# Patient Record
Sex: Male | Born: 1945
Health system: Southern US, Community
[De-identification: ages and names within clinical notes are randomized; demographics above are authoritative.]

---

## 2015-08-18 DIAGNOSIS — M791 Myalgia: Secondary | ICD-10-CM | POA: Diagnosis not present

## 2015-08-18 DIAGNOSIS — R350 Frequency of micturition: Secondary | ICD-10-CM | POA: Diagnosis not present

## 2015-08-18 DIAGNOSIS — R590 Localized enlarged lymph nodes: Secondary | ICD-10-CM | POA: Diagnosis not present

## 2015-08-18 DIAGNOSIS — R030 Elevated blood-pressure reading, without diagnosis of hypertension: Secondary | ICD-10-CM | POA: Diagnosis not present

## 2015-08-18 DIAGNOSIS — L259 Unspecified contact dermatitis, unspecified cause: Secondary | ICD-10-CM | POA: Diagnosis not present

## 2015-08-24 DIAGNOSIS — Z794 Long term (current) use of insulin: Secondary | ICD-10-CM | POA: Diagnosis not present

## 2015-08-24 DIAGNOSIS — M791 Myalgia: Secondary | ICD-10-CM | POA: Diagnosis not present

## 2015-08-24 DIAGNOSIS — Z1211 Encounter for screening for malignant neoplasm of colon: Secondary | ICD-10-CM | POA: Diagnosis not present

## 2015-08-24 DIAGNOSIS — Z23 Encounter for immunization: Secondary | ICD-10-CM | POA: Diagnosis not present

## 2015-08-24 DIAGNOSIS — E1165 Type 2 diabetes mellitus with hyperglycemia: Secondary | ICD-10-CM | POA: Diagnosis not present

## 2015-09-12 DIAGNOSIS — Z7189 Other specified counseling: Secondary | ICD-10-CM | POA: Diagnosis not present

## 2015-09-12 DIAGNOSIS — Z23 Encounter for immunization: Secondary | ICD-10-CM | POA: Diagnosis not present

## 2015-10-10 ENCOUNTER — Other Ambulatory Visit: Payer: Self-pay | Admitting: Family

## 2015-10-10 ENCOUNTER — Ambulatory Visit
Admission: RE | Admit: 2015-10-10 | Discharge: 2015-10-10 | Disposition: A | Discharge status: Home / Self Care | Payer: Medicare Other | Source: Ambulatory Visit | Attending: Family | Admitting: Family

## 2015-10-10 DIAGNOSIS — M79604 Pain in right leg: Secondary | ICD-10-CM | POA: Diagnosis not present

## 2015-10-10 DIAGNOSIS — M5136 Other intervertebral disc degeneration, lumbar region: Secondary | ICD-10-CM | POA: Diagnosis not present

## 2015-10-10 DIAGNOSIS — M25552 Pain in left hip: Secondary | ICD-10-CM | POA: Diagnosis not present

## 2015-10-10 DIAGNOSIS — M1611 Unilateral primary osteoarthritis, right hip: Secondary | ICD-10-CM | POA: Diagnosis not present

## 2015-10-10 DIAGNOSIS — E1165 Type 2 diabetes mellitus with hyperglycemia: Secondary | ICD-10-CM | POA: Diagnosis not present

## 2015-10-10 DIAGNOSIS — R194 Change in bowel habit: Secondary | ICD-10-CM | POA: Diagnosis not present

## 2015-10-10 DIAGNOSIS — M25551 Pain in right hip: Secondary | ICD-10-CM | POA: Diagnosis not present

## 2015-10-28 DIAGNOSIS — M791 Myalgia: Secondary | ICD-10-CM | POA: Diagnosis not present

## 2015-10-28 DIAGNOSIS — M25551 Pain in right hip: Secondary | ICD-10-CM | POA: Diagnosis not present

## 2015-11-02 DIAGNOSIS — R194 Change in bowel habit: Secondary | ICD-10-CM | POA: Diagnosis not present

## 2015-11-02 DIAGNOSIS — M791 Myalgia: Secondary | ICD-10-CM | POA: Diagnosis not present

## 2015-11-08 DIAGNOSIS — Z13220 Encounter for screening for lipoid disorders: Secondary | ICD-10-CM | POA: Diagnosis not present

## 2015-11-08 DIAGNOSIS — E1165 Type 2 diabetes mellitus with hyperglycemia: Secondary | ICD-10-CM | POA: Diagnosis not present

## 2015-11-08 DIAGNOSIS — F4321 Adjustment disorder with depressed mood: Secondary | ICD-10-CM | POA: Diagnosis not present

## 2015-11-08 DIAGNOSIS — Z79899 Other long term (current) drug therapy: Secondary | ICD-10-CM | POA: Diagnosis not present

## 2015-11-08 DIAGNOSIS — Z794 Long term (current) use of insulin: Secondary | ICD-10-CM | POA: Diagnosis not present

## 2015-11-14 DIAGNOSIS — E1165 Type 2 diabetes mellitus with hyperglycemia: Secondary | ICD-10-CM | POA: Diagnosis not present

## 2015-11-14 DIAGNOSIS — Z13220 Encounter for screening for lipoid disorders: Secondary | ICD-10-CM | POA: Diagnosis not present

## 2015-11-14 DIAGNOSIS — Z79899 Other long term (current) drug therapy: Secondary | ICD-10-CM | POA: Diagnosis not present

## 2015-12-08 DIAGNOSIS — D122 Benign neoplasm of ascending colon: Secondary | ICD-10-CM | POA: Diagnosis not present

## 2015-12-08 DIAGNOSIS — K648 Other hemorrhoids: Secondary | ICD-10-CM | POA: Diagnosis not present

## 2015-12-08 DIAGNOSIS — Z794 Long term (current) use of insulin: Secondary | ICD-10-CM | POA: Diagnosis not present

## 2015-12-08 DIAGNOSIS — I10 Essential (primary) hypertension: Secondary | ICD-10-CM | POA: Diagnosis not present

## 2015-12-08 DIAGNOSIS — K6289 Other specified diseases of anus and rectum: Secondary | ICD-10-CM | POA: Diagnosis not present

## 2015-12-08 DIAGNOSIS — E119 Type 2 diabetes mellitus without complications: Secondary | ICD-10-CM | POA: Diagnosis not present

## 2015-12-08 DIAGNOSIS — D125 Benign neoplasm of sigmoid colon: Secondary | ICD-10-CM | POA: Diagnosis not present

## 2015-12-08 DIAGNOSIS — R194 Change in bowel habit: Secondary | ICD-10-CM | POA: Diagnosis not present

## 2016-01-09 DIAGNOSIS — E7800 Pure hypercholesterolemia, unspecified: Secondary | ICD-10-CM | POA: Diagnosis not present

## 2016-01-09 DIAGNOSIS — E1165 Type 2 diabetes mellitus with hyperglycemia: Secondary | ICD-10-CM | POA: Diagnosis not present

## 2016-01-16 DIAGNOSIS — K6289 Other specified diseases of anus and rectum: Secondary | ICD-10-CM | POA: Diagnosis not present

## 2016-02-01 DIAGNOSIS — K6289 Other specified diseases of anus and rectum: Secondary | ICD-10-CM | POA: Diagnosis not present

## 2016-02-01 DIAGNOSIS — E119 Type 2 diabetes mellitus without complications: Secondary | ICD-10-CM | POA: Diagnosis not present

## 2016-03-14 DIAGNOSIS — M62838 Other muscle spasm: Secondary | ICD-10-CM | POA: Diagnosis not present

## 2016-03-14 DIAGNOSIS — R102 Pelvic and perineal pain: Secondary | ICD-10-CM | POA: Diagnosis not present

## 2016-03-14 DIAGNOSIS — K5900 Constipation, unspecified: Secondary | ICD-10-CM | POA: Diagnosis not present

## 2016-03-14 DIAGNOSIS — R278 Other lack of coordination: Secondary | ICD-10-CM | POA: Diagnosis not present

## 2016-03-18 DIAGNOSIS — R278 Other lack of coordination: Secondary | ICD-10-CM | POA: Diagnosis not present

## 2016-03-18 DIAGNOSIS — M62838 Other muscle spasm: Secondary | ICD-10-CM | POA: Diagnosis not present

## 2016-03-18 DIAGNOSIS — R102 Pelvic and perineal pain: Secondary | ICD-10-CM | POA: Diagnosis not present

## 2016-03-18 DIAGNOSIS — K5900 Constipation, unspecified: Secondary | ICD-10-CM | POA: Diagnosis not present

## 2016-03-18 DIAGNOSIS — M6281 Muscle weakness (generalized): Secondary | ICD-10-CM | POA: Diagnosis not present

## 2016-03-26 DIAGNOSIS — K5900 Constipation, unspecified: Secondary | ICD-10-CM | POA: Diagnosis not present

## 2016-03-26 DIAGNOSIS — M6281 Muscle weakness (generalized): Secondary | ICD-10-CM | POA: Diagnosis not present

## 2016-03-26 DIAGNOSIS — M62838 Other muscle spasm: Secondary | ICD-10-CM | POA: Diagnosis not present

## 2016-03-26 DIAGNOSIS — R278 Other lack of coordination: Secondary | ICD-10-CM | POA: Diagnosis not present

## 2016-03-26 DIAGNOSIS — R102 Pelvic and perineal pain: Secondary | ICD-10-CM | POA: Diagnosis not present

## 2016-03-28 DIAGNOSIS — R102 Pelvic and perineal pain: Secondary | ICD-10-CM | POA: Diagnosis not present

## 2016-03-28 DIAGNOSIS — M6281 Muscle weakness (generalized): Secondary | ICD-10-CM | POA: Diagnosis not present

## 2016-03-28 DIAGNOSIS — R278 Other lack of coordination: Secondary | ICD-10-CM | POA: Diagnosis not present

## 2016-03-28 DIAGNOSIS — M62838 Other muscle spasm: Secondary | ICD-10-CM | POA: Diagnosis not present

## 2016-03-28 DIAGNOSIS — K5900 Constipation, unspecified: Secondary | ICD-10-CM | POA: Diagnosis not present

## 2016-04-02 DIAGNOSIS — R102 Pelvic and perineal pain: Secondary | ICD-10-CM | POA: Diagnosis not present

## 2016-04-02 DIAGNOSIS — M62838 Other muscle spasm: Secondary | ICD-10-CM | POA: Diagnosis not present

## 2016-04-02 DIAGNOSIS — K5900 Constipation, unspecified: Secondary | ICD-10-CM | POA: Diagnosis not present

## 2016-04-02 DIAGNOSIS — M6281 Muscle weakness (generalized): Secondary | ICD-10-CM | POA: Diagnosis not present

## 2016-04-02 DIAGNOSIS — R278 Other lack of coordination: Secondary | ICD-10-CM | POA: Diagnosis not present

## 2016-04-04 DIAGNOSIS — R278 Other lack of coordination: Secondary | ICD-10-CM | POA: Diagnosis not present

## 2016-04-04 DIAGNOSIS — M62838 Other muscle spasm: Secondary | ICD-10-CM | POA: Diagnosis not present

## 2016-04-04 DIAGNOSIS — M6281 Muscle weakness (generalized): Secondary | ICD-10-CM | POA: Diagnosis not present

## 2016-04-04 DIAGNOSIS — K5900 Constipation, unspecified: Secondary | ICD-10-CM | POA: Diagnosis not present

## 2016-04-04 DIAGNOSIS — R102 Pelvic and perineal pain: Secondary | ICD-10-CM | POA: Diagnosis not present

## 2016-04-08 DIAGNOSIS — M62838 Other muscle spasm: Secondary | ICD-10-CM | POA: Diagnosis not present

## 2016-04-08 DIAGNOSIS — R102 Pelvic and perineal pain: Secondary | ICD-10-CM | POA: Diagnosis not present

## 2016-04-08 DIAGNOSIS — R278 Other lack of coordination: Secondary | ICD-10-CM | POA: Diagnosis not present

## 2016-04-08 DIAGNOSIS — M6281 Muscle weakness (generalized): Secondary | ICD-10-CM | POA: Diagnosis not present

## 2016-04-08 DIAGNOSIS — K5900 Constipation, unspecified: Secondary | ICD-10-CM | POA: Diagnosis not present

## 2016-04-10 DIAGNOSIS — R278 Other lack of coordination: Secondary | ICD-10-CM | POA: Diagnosis not present

## 2016-04-10 DIAGNOSIS — R102 Pelvic and perineal pain: Secondary | ICD-10-CM | POA: Diagnosis not present

## 2016-04-10 DIAGNOSIS — M6281 Muscle weakness (generalized): Secondary | ICD-10-CM | POA: Diagnosis not present

## 2016-04-10 DIAGNOSIS — M62838 Other muscle spasm: Secondary | ICD-10-CM | POA: Diagnosis not present

## 2016-04-10 DIAGNOSIS — K5900 Constipation, unspecified: Secondary | ICD-10-CM | POA: Diagnosis not present

## 2016-04-15 DIAGNOSIS — R278 Other lack of coordination: Secondary | ICD-10-CM | POA: Diagnosis not present

## 2016-04-15 DIAGNOSIS — M62838 Other muscle spasm: Secondary | ICD-10-CM | POA: Diagnosis not present

## 2016-04-15 DIAGNOSIS — M6281 Muscle weakness (generalized): Secondary | ICD-10-CM | POA: Diagnosis not present

## 2016-04-15 DIAGNOSIS — K5900 Constipation, unspecified: Secondary | ICD-10-CM | POA: Diagnosis not present

## 2016-04-17 DIAGNOSIS — R278 Other lack of coordination: Secondary | ICD-10-CM | POA: Diagnosis not present

## 2016-04-17 DIAGNOSIS — R102 Pelvic and perineal pain: Secondary | ICD-10-CM | POA: Diagnosis not present

## 2016-04-17 DIAGNOSIS — M6281 Muscle weakness (generalized): Secondary | ICD-10-CM | POA: Diagnosis not present

## 2016-04-17 DIAGNOSIS — M62838 Other muscle spasm: Secondary | ICD-10-CM | POA: Diagnosis not present

## 2016-04-22 DIAGNOSIS — K5900 Constipation, unspecified: Secondary | ICD-10-CM | POA: Diagnosis not present

## 2016-04-22 DIAGNOSIS — M62838 Other muscle spasm: Secondary | ICD-10-CM | POA: Diagnosis not present

## 2016-04-22 DIAGNOSIS — M6281 Muscle weakness (generalized): Secondary | ICD-10-CM | POA: Diagnosis not present

## 2016-04-22 DIAGNOSIS — R278 Other lack of coordination: Secondary | ICD-10-CM | POA: Diagnosis not present

## 2016-04-22 DIAGNOSIS — R102 Pelvic and perineal pain: Secondary | ICD-10-CM | POA: Diagnosis not present

## 2016-05-01 DIAGNOSIS — E7800 Pure hypercholesterolemia, unspecified: Secondary | ICD-10-CM | POA: Diagnosis not present

## 2016-05-01 DIAGNOSIS — E1165 Type 2 diabetes mellitus with hyperglycemia: Secondary | ICD-10-CM | POA: Diagnosis not present

## 2016-05-02 DIAGNOSIS — M62838 Other muscle spasm: Secondary | ICD-10-CM | POA: Diagnosis not present

## 2016-05-02 DIAGNOSIS — R102 Pelvic and perineal pain: Secondary | ICD-10-CM | POA: Diagnosis not present

## 2016-05-02 DIAGNOSIS — R278 Other lack of coordination: Secondary | ICD-10-CM | POA: Diagnosis not present

## 2016-05-02 DIAGNOSIS — K5900 Constipation, unspecified: Secondary | ICD-10-CM | POA: Diagnosis not present

## 2016-05-02 DIAGNOSIS — M6281 Muscle weakness (generalized): Secondary | ICD-10-CM | POA: Diagnosis not present

## 2016-05-09 DIAGNOSIS — M62838 Other muscle spasm: Secondary | ICD-10-CM | POA: Diagnosis not present

## 2016-05-09 DIAGNOSIS — M6281 Muscle weakness (generalized): Secondary | ICD-10-CM | POA: Diagnosis not present

## 2016-05-09 DIAGNOSIS — R278 Other lack of coordination: Secondary | ICD-10-CM | POA: Diagnosis not present

## 2016-05-09 DIAGNOSIS — R102 Pelvic and perineal pain: Secondary | ICD-10-CM | POA: Diagnosis not present

## 2016-05-09 DIAGNOSIS — K5900 Constipation, unspecified: Secondary | ICD-10-CM | POA: Diagnosis not present

## 2016-05-13 DIAGNOSIS — Z79899 Other long term (current) drug therapy: Secondary | ICD-10-CM | POA: Diagnosis not present

## 2016-05-13 DIAGNOSIS — M5440 Lumbago with sciatica, unspecified side: Secondary | ICD-10-CM | POA: Diagnosis not present

## 2016-05-13 DIAGNOSIS — G8929 Other chronic pain: Secondary | ICD-10-CM | POA: Diagnosis not present

## 2016-05-13 DIAGNOSIS — M542 Cervicalgia: Secondary | ICD-10-CM | POA: Diagnosis not present

## 2016-05-13 DIAGNOSIS — R292 Abnormal reflex: Secondary | ICD-10-CM | POA: Diagnosis not present

## 2016-05-13 DIAGNOSIS — E119 Type 2 diabetes mellitus without complications: Secondary | ICD-10-CM | POA: Diagnosis not present

## 2016-05-15 DIAGNOSIS — M6281 Muscle weakness (generalized): Secondary | ICD-10-CM | POA: Diagnosis not present

## 2016-05-15 DIAGNOSIS — K5900 Constipation, unspecified: Secondary | ICD-10-CM | POA: Diagnosis not present

## 2016-05-15 DIAGNOSIS — R278 Other lack of coordination: Secondary | ICD-10-CM | POA: Diagnosis not present

## 2016-05-15 DIAGNOSIS — R102 Pelvic and perineal pain: Secondary | ICD-10-CM | POA: Diagnosis not present

## 2016-05-23 DIAGNOSIS — R278 Other lack of coordination: Secondary | ICD-10-CM | POA: Diagnosis not present

## 2016-05-23 DIAGNOSIS — K5900 Constipation, unspecified: Secondary | ICD-10-CM | POA: Diagnosis not present

## 2016-05-23 DIAGNOSIS — R102 Pelvic and perineal pain: Secondary | ICD-10-CM | POA: Diagnosis not present

## 2016-05-23 DIAGNOSIS — M6281 Muscle weakness (generalized): Secondary | ICD-10-CM | POA: Diagnosis not present

## 2016-05-23 DIAGNOSIS — M62838 Other muscle spasm: Secondary | ICD-10-CM | POA: Diagnosis not present

## 2016-05-30 DIAGNOSIS — M9973 Connective tissue and disc stenosis of intervertebral foramina of lumbar region: Secondary | ICD-10-CM | POA: Diagnosis not present

## 2016-05-30 DIAGNOSIS — M9971 Connective tissue and disc stenosis of intervertebral foramina of cervical region: Secondary | ICD-10-CM | POA: Diagnosis not present

## 2016-06-06 DIAGNOSIS — M6281 Muscle weakness (generalized): Secondary | ICD-10-CM | POA: Diagnosis not present

## 2016-06-06 DIAGNOSIS — M62838 Other muscle spasm: Secondary | ICD-10-CM | POA: Diagnosis not present

## 2016-06-06 DIAGNOSIS — R102 Pelvic and perineal pain: Secondary | ICD-10-CM | POA: Diagnosis not present

## 2016-06-06 DIAGNOSIS — K5900 Constipation, unspecified: Secondary | ICD-10-CM | POA: Diagnosis not present

## 2016-06-26 DIAGNOSIS — M5442 Lumbago with sciatica, left side: Secondary | ICD-10-CM | POA: Diagnosis not present

## 2016-06-26 DIAGNOSIS — R29898 Other symptoms and signs involving the musculoskeletal system: Secondary | ICD-10-CM | POA: Diagnosis not present

## 2016-06-26 DIAGNOSIS — M5441 Lumbago with sciatica, right side: Secondary | ICD-10-CM | POA: Diagnosis not present

## 2016-06-26 DIAGNOSIS — M5416 Radiculopathy, lumbar region: Secondary | ICD-10-CM | POA: Diagnosis not present

## 2016-06-26 DIAGNOSIS — M6281 Muscle weakness (generalized): Secondary | ICD-10-CM | POA: Diagnosis not present

## 2016-06-26 DIAGNOSIS — G8929 Other chronic pain: Secondary | ICD-10-CM | POA: Diagnosis not present

## 2016-07-05 DIAGNOSIS — M6281 Muscle weakness (generalized): Secondary | ICD-10-CM | POA: Diagnosis not present

## 2016-07-05 DIAGNOSIS — M5442 Lumbago with sciatica, left side: Secondary | ICD-10-CM | POA: Diagnosis not present

## 2016-07-05 DIAGNOSIS — M5416 Radiculopathy, lumbar region: Secondary | ICD-10-CM | POA: Diagnosis not present

## 2016-07-05 DIAGNOSIS — R29898 Other symptoms and signs involving the musculoskeletal system: Secondary | ICD-10-CM | POA: Diagnosis not present

## 2016-07-05 DIAGNOSIS — M5441 Lumbago with sciatica, right side: Secondary | ICD-10-CM | POA: Diagnosis not present

## 2016-07-05 DIAGNOSIS — G8929 Other chronic pain: Secondary | ICD-10-CM | POA: Diagnosis not present

## 2016-07-17 DIAGNOSIS — M5442 Lumbago with sciatica, left side: Secondary | ICD-10-CM | POA: Diagnosis not present

## 2016-07-17 DIAGNOSIS — M5441 Lumbago with sciatica, right side: Secondary | ICD-10-CM | POA: Diagnosis not present

## 2016-07-17 DIAGNOSIS — R29898 Other symptoms and signs involving the musculoskeletal system: Secondary | ICD-10-CM | POA: Diagnosis not present

## 2016-07-17 DIAGNOSIS — G8929 Other chronic pain: Secondary | ICD-10-CM | POA: Diagnosis not present

## 2016-07-17 DIAGNOSIS — M5416 Radiculopathy, lumbar region: Secondary | ICD-10-CM | POA: Diagnosis not present

## 2016-07-17 DIAGNOSIS — M6281 Muscle weakness (generalized): Secondary | ICD-10-CM | POA: Diagnosis not present

## 2016-07-24 DIAGNOSIS — G8929 Other chronic pain: Secondary | ICD-10-CM | POA: Diagnosis not present

## 2016-07-24 DIAGNOSIS — M5442 Lumbago with sciatica, left side: Secondary | ICD-10-CM | POA: Diagnosis not present

## 2016-07-24 DIAGNOSIS — M5416 Radiculopathy, lumbar region: Secondary | ICD-10-CM | POA: Diagnosis not present

## 2016-07-24 DIAGNOSIS — R29898 Other symptoms and signs involving the musculoskeletal system: Secondary | ICD-10-CM | POA: Diagnosis not present

## 2016-07-24 DIAGNOSIS — M6281 Muscle weakness (generalized): Secondary | ICD-10-CM | POA: Diagnosis not present

## 2016-07-24 DIAGNOSIS — M5441 Lumbago with sciatica, right side: Secondary | ICD-10-CM | POA: Diagnosis not present

## 2016-07-26 DIAGNOSIS — M5441 Lumbago with sciatica, right side: Secondary | ICD-10-CM | POA: Diagnosis not present

## 2016-07-26 DIAGNOSIS — G8929 Other chronic pain: Secondary | ICD-10-CM | POA: Diagnosis not present

## 2016-07-26 DIAGNOSIS — M6281 Muscle weakness (generalized): Secondary | ICD-10-CM | POA: Diagnosis not present

## 2016-07-26 DIAGNOSIS — M5442 Lumbago with sciatica, left side: Secondary | ICD-10-CM | POA: Diagnosis not present

## 2016-07-26 DIAGNOSIS — M5416 Radiculopathy, lumbar region: Secondary | ICD-10-CM | POA: Diagnosis not present

## 2016-07-26 DIAGNOSIS — R29898 Other symptoms and signs involving the musculoskeletal system: Secondary | ICD-10-CM | POA: Diagnosis not present

## 2016-07-29 DIAGNOSIS — M5442 Lumbago with sciatica, left side: Secondary | ICD-10-CM | POA: Diagnosis not present

## 2016-07-29 DIAGNOSIS — G8929 Other chronic pain: Secondary | ICD-10-CM | POA: Diagnosis not present

## 2016-07-29 DIAGNOSIS — M5416 Radiculopathy, lumbar region: Secondary | ICD-10-CM | POA: Diagnosis not present

## 2016-07-29 DIAGNOSIS — R29898 Other symptoms and signs involving the musculoskeletal system: Secondary | ICD-10-CM | POA: Diagnosis not present

## 2016-07-29 DIAGNOSIS — M6281 Muscle weakness (generalized): Secondary | ICD-10-CM | POA: Diagnosis not present

## 2016-07-29 DIAGNOSIS — M5441 Lumbago with sciatica, right side: Secondary | ICD-10-CM | POA: Diagnosis not present

## 2016-07-31 DIAGNOSIS — Z79899 Other long term (current) drug therapy: Secondary | ICD-10-CM | POA: Diagnosis not present

## 2016-07-31 DIAGNOSIS — M5416 Radiculopathy, lumbar region: Secondary | ICD-10-CM | POA: Diagnosis not present

## 2016-07-31 DIAGNOSIS — E119 Type 2 diabetes mellitus without complications: Secondary | ICD-10-CM | POA: Diagnosis not present

## 2016-07-31 DIAGNOSIS — M4802 Spinal stenosis, cervical region: Secondary | ICD-10-CM | POA: Diagnosis not present

## 2016-07-31 DIAGNOSIS — M5410 Radiculopathy, site unspecified: Secondary | ICD-10-CM | POA: Diagnosis not present

## 2016-08-05 DIAGNOSIS — M5442 Lumbago with sciatica, left side: Secondary | ICD-10-CM | POA: Diagnosis not present

## 2016-08-05 DIAGNOSIS — G8929 Other chronic pain: Secondary | ICD-10-CM | POA: Diagnosis not present

## 2016-08-05 DIAGNOSIS — M5441 Lumbago with sciatica, right side: Secondary | ICD-10-CM | POA: Diagnosis not present

## 2016-08-05 DIAGNOSIS — M5416 Radiculopathy, lumbar region: Secondary | ICD-10-CM | POA: Diagnosis not present

## 2016-08-05 DIAGNOSIS — R29898 Other symptoms and signs involving the musculoskeletal system: Secondary | ICD-10-CM | POA: Diagnosis not present

## 2016-08-05 DIAGNOSIS — M6281 Muscle weakness (generalized): Secondary | ICD-10-CM | POA: Diagnosis not present

## 2016-08-07 DIAGNOSIS — R29898 Other symptoms and signs involving the musculoskeletal system: Secondary | ICD-10-CM | POA: Diagnosis not present

## 2016-08-07 DIAGNOSIS — G8929 Other chronic pain: Secondary | ICD-10-CM | POA: Diagnosis not present

## 2016-08-07 DIAGNOSIS — M6281 Muscle weakness (generalized): Secondary | ICD-10-CM | POA: Diagnosis not present

## 2016-08-07 DIAGNOSIS — M5442 Lumbago with sciatica, left side: Secondary | ICD-10-CM | POA: Diagnosis not present

## 2016-08-07 DIAGNOSIS — M5441 Lumbago with sciatica, right side: Secondary | ICD-10-CM | POA: Diagnosis not present

## 2016-08-07 DIAGNOSIS — M5416 Radiculopathy, lumbar region: Secondary | ICD-10-CM | POA: Diagnosis not present

## 2016-08-12 DIAGNOSIS — R29898 Other symptoms and signs involving the musculoskeletal system: Secondary | ICD-10-CM | POA: Diagnosis not present

## 2016-08-12 DIAGNOSIS — M5442 Lumbago with sciatica, left side: Secondary | ICD-10-CM | POA: Diagnosis not present

## 2016-08-12 DIAGNOSIS — G8929 Other chronic pain: Secondary | ICD-10-CM | POA: Diagnosis not present

## 2016-08-12 DIAGNOSIS — M5416 Radiculopathy, lumbar region: Secondary | ICD-10-CM | POA: Diagnosis not present

## 2016-08-12 DIAGNOSIS — M6281 Muscle weakness (generalized): Secondary | ICD-10-CM | POA: Diagnosis not present

## 2016-08-12 DIAGNOSIS — M5441 Lumbago with sciatica, right side: Secondary | ICD-10-CM | POA: Diagnosis not present

## 2016-08-14 DIAGNOSIS — M5416 Radiculopathy, lumbar region: Secondary | ICD-10-CM | POA: Diagnosis not present

## 2016-08-14 DIAGNOSIS — R29898 Other symptoms and signs involving the musculoskeletal system: Secondary | ICD-10-CM | POA: Diagnosis not present

## 2016-08-14 DIAGNOSIS — M5442 Lumbago with sciatica, left side: Secondary | ICD-10-CM | POA: Diagnosis not present

## 2016-08-14 DIAGNOSIS — M6281 Muscle weakness (generalized): Secondary | ICD-10-CM | POA: Diagnosis not present

## 2016-08-14 DIAGNOSIS — G8929 Other chronic pain: Secondary | ICD-10-CM | POA: Diagnosis not present

## 2016-08-14 DIAGNOSIS — M5441 Lumbago with sciatica, right side: Secondary | ICD-10-CM | POA: Diagnosis not present

## 2016-08-19 DIAGNOSIS — M5416 Radiculopathy, lumbar region: Secondary | ICD-10-CM | POA: Diagnosis not present

## 2016-08-19 DIAGNOSIS — R29898 Other symptoms and signs involving the musculoskeletal system: Secondary | ICD-10-CM | POA: Diagnosis not present

## 2016-08-19 DIAGNOSIS — M6281 Muscle weakness (generalized): Secondary | ICD-10-CM | POA: Diagnosis not present

## 2016-08-19 DIAGNOSIS — M5441 Lumbago with sciatica, right side: Secondary | ICD-10-CM | POA: Diagnosis not present

## 2016-08-19 DIAGNOSIS — M5442 Lumbago with sciatica, left side: Secondary | ICD-10-CM | POA: Diagnosis not present

## 2016-08-19 DIAGNOSIS — G8929 Other chronic pain: Secondary | ICD-10-CM | POA: Diagnosis not present

## 2016-08-21 DIAGNOSIS — M5442 Lumbago with sciatica, left side: Secondary | ICD-10-CM | POA: Diagnosis not present

## 2016-08-21 DIAGNOSIS — R29898 Other symptoms and signs involving the musculoskeletal system: Secondary | ICD-10-CM | POA: Diagnosis not present

## 2016-08-21 DIAGNOSIS — M5441 Lumbago with sciatica, right side: Secondary | ICD-10-CM | POA: Diagnosis not present

## 2016-08-21 DIAGNOSIS — G8929 Other chronic pain: Secondary | ICD-10-CM | POA: Diagnosis not present

## 2016-08-21 DIAGNOSIS — M6281 Muscle weakness (generalized): Secondary | ICD-10-CM | POA: Diagnosis not present

## 2016-08-21 DIAGNOSIS — M5416 Radiculopathy, lumbar region: Secondary | ICD-10-CM | POA: Diagnosis not present

## 2016-08-26 DIAGNOSIS — G8929 Other chronic pain: Secondary | ICD-10-CM | POA: Diagnosis not present

## 2016-08-26 DIAGNOSIS — M5442 Lumbago with sciatica, left side: Secondary | ICD-10-CM | POA: Diagnosis not present

## 2016-08-26 DIAGNOSIS — M6281 Muscle weakness (generalized): Secondary | ICD-10-CM | POA: Diagnosis not present

## 2016-08-26 DIAGNOSIS — R29898 Other symptoms and signs involving the musculoskeletal system: Secondary | ICD-10-CM | POA: Diagnosis not present

## 2016-08-26 DIAGNOSIS — M5416 Radiculopathy, lumbar region: Secondary | ICD-10-CM | POA: Diagnosis not present

## 2016-08-26 DIAGNOSIS — M5441 Lumbago with sciatica, right side: Secondary | ICD-10-CM | POA: Diagnosis not present

## 2016-09-02 DIAGNOSIS — M5416 Radiculopathy, lumbar region: Secondary | ICD-10-CM | POA: Diagnosis not present

## 2016-09-02 DIAGNOSIS — R29898 Other symptoms and signs involving the musculoskeletal system: Secondary | ICD-10-CM | POA: Diagnosis not present

## 2016-09-02 DIAGNOSIS — M6281 Muscle weakness (generalized): Secondary | ICD-10-CM | POA: Diagnosis not present

## 2016-09-02 DIAGNOSIS — M5442 Lumbago with sciatica, left side: Secondary | ICD-10-CM | POA: Diagnosis not present

## 2016-09-02 DIAGNOSIS — M5441 Lumbago with sciatica, right side: Secondary | ICD-10-CM | POA: Diagnosis not present

## 2016-09-02 DIAGNOSIS — G8929 Other chronic pain: Secondary | ICD-10-CM | POA: Diagnosis not present

## 2017-01-08 DIAGNOSIS — R269 Unspecified abnormalities of gait and mobility: Secondary | ICD-10-CM | POA: Diagnosis not present

## 2017-01-08 DIAGNOSIS — Z887 Allergy status to serum and vaccine status: Secondary | ICD-10-CM | POA: Diagnosis not present

## 2017-01-08 DIAGNOSIS — I638 Other cerebral infarction: Secondary | ICD-10-CM | POA: Diagnosis not present

## 2017-01-08 DIAGNOSIS — M4800 Spinal stenosis, site unspecified: Secondary | ICD-10-CM | POA: Diagnosis not present

## 2017-01-08 DIAGNOSIS — M5416 Radiculopathy, lumbar region: Secondary | ICD-10-CM | POA: Diagnosis not present

## 2017-01-08 DIAGNOSIS — R29810 Facial weakness: Secondary | ICD-10-CM | POA: Diagnosis not present

## 2017-01-08 DIAGNOSIS — E118 Type 2 diabetes mellitus with unspecified complications: Secondary | ICD-10-CM | POA: Diagnosis not present

## 2017-01-08 DIAGNOSIS — E876 Hypokalemia: Secondary | ICD-10-CM | POA: Diagnosis not present

## 2017-01-08 DIAGNOSIS — I639 Cerebral infarction, unspecified: Secondary | ICD-10-CM | POA: Diagnosis not present

## 2017-01-08 DIAGNOSIS — R531 Weakness: Secondary | ICD-10-CM | POA: Diagnosis not present

## 2017-01-08 DIAGNOSIS — R4781 Slurred speech: Secondary | ICD-10-CM | POA: Diagnosis not present

## 2017-01-08 DIAGNOSIS — E119 Type 2 diabetes mellitus without complications: Secondary | ICD-10-CM | POA: Diagnosis not present

## 2017-01-08 DIAGNOSIS — Z79899 Other long term (current) drug therapy: Secondary | ICD-10-CM | POA: Diagnosis not present

## 2017-01-08 DIAGNOSIS — Z7984 Long term (current) use of oral hypoglycemic drugs: Secondary | ICD-10-CM | POA: Diagnosis not present

## 2017-01-08 DIAGNOSIS — I6523 Occlusion and stenosis of bilateral carotid arteries: Secondary | ICD-10-CM | POA: Diagnosis not present

## 2017-01-08 DIAGNOSIS — I10 Essential (primary) hypertension: Secondary | ICD-10-CM | POA: Diagnosis not present

## 2017-01-08 DIAGNOSIS — G319 Degenerative disease of nervous system, unspecified: Secondary | ICD-10-CM | POA: Diagnosis not present

## 2017-01-08 DIAGNOSIS — I493 Ventricular premature depolarization: Secondary | ICD-10-CM | POA: Diagnosis not present

## 2017-01-08 DIAGNOSIS — Z91018 Allergy to other foods: Secondary | ICD-10-CM | POA: Diagnosis not present

## 2017-01-09 DIAGNOSIS — I080 Rheumatic disorders of both mitral and aortic valves: Secondary | ICD-10-CM | POA: Diagnosis not present

## 2017-01-09 DIAGNOSIS — I519 Heart disease, unspecified: Secondary | ICD-10-CM | POA: Diagnosis not present

## 2017-01-09 DIAGNOSIS — I639 Cerebral infarction, unspecified: Secondary | ICD-10-CM | POA: Diagnosis not present

## 2017-01-13 DIAGNOSIS — Z7984 Long term (current) use of oral hypoglycemic drugs: Secondary | ICD-10-CM | POA: Diagnosis not present

## 2017-01-13 DIAGNOSIS — I69320 Aphasia following cerebral infarction: Secondary | ICD-10-CM | POA: Diagnosis not present

## 2017-01-13 DIAGNOSIS — I10 Essential (primary) hypertension: Secondary | ICD-10-CM | POA: Diagnosis not present

## 2017-01-13 DIAGNOSIS — Z9181 History of falling: Secondary | ICD-10-CM | POA: Diagnosis not present

## 2017-01-13 DIAGNOSIS — E119 Type 2 diabetes mellitus without complications: Secondary | ICD-10-CM | POA: Diagnosis not present

## 2017-01-13 DIAGNOSIS — M6281 Muscle weakness (generalized): Secondary | ICD-10-CM | POA: Diagnosis not present

## 2017-01-13 DIAGNOSIS — R278 Other lack of coordination: Secondary | ICD-10-CM | POA: Diagnosis not present

## 2017-01-14 DIAGNOSIS — I69320 Aphasia following cerebral infarction: Secondary | ICD-10-CM | POA: Diagnosis not present

## 2017-01-14 DIAGNOSIS — E119 Type 2 diabetes mellitus without complications: Secondary | ICD-10-CM | POA: Diagnosis not present

## 2017-01-14 DIAGNOSIS — M6281 Muscle weakness (generalized): Secondary | ICD-10-CM | POA: Diagnosis not present

## 2017-01-14 DIAGNOSIS — Z7984 Long term (current) use of oral hypoglycemic drugs: Secondary | ICD-10-CM | POA: Diagnosis not present

## 2017-01-14 DIAGNOSIS — I10 Essential (primary) hypertension: Secondary | ICD-10-CM | POA: Diagnosis not present

## 2017-01-14 DIAGNOSIS — R278 Other lack of coordination: Secondary | ICD-10-CM | POA: Diagnosis not present

## 2017-01-15 DIAGNOSIS — R278 Other lack of coordination: Secondary | ICD-10-CM | POA: Diagnosis not present

## 2017-01-15 DIAGNOSIS — I10 Essential (primary) hypertension: Secondary | ICD-10-CM | POA: Diagnosis not present

## 2017-01-15 DIAGNOSIS — E119 Type 2 diabetes mellitus without complications: Secondary | ICD-10-CM | POA: Diagnosis not present

## 2017-01-15 DIAGNOSIS — Z7984 Long term (current) use of oral hypoglycemic drugs: Secondary | ICD-10-CM | POA: Diagnosis not present

## 2017-01-15 DIAGNOSIS — M6281 Muscle weakness (generalized): Secondary | ICD-10-CM | POA: Diagnosis not present

## 2017-01-15 DIAGNOSIS — I69320 Aphasia following cerebral infarction: Secondary | ICD-10-CM | POA: Diagnosis not present

## 2017-01-16 DIAGNOSIS — Z7984 Long term (current) use of oral hypoglycemic drugs: Secondary | ICD-10-CM | POA: Diagnosis not present

## 2017-01-16 DIAGNOSIS — I69320 Aphasia following cerebral infarction: Secondary | ICD-10-CM | POA: Diagnosis not present

## 2017-01-16 DIAGNOSIS — E119 Type 2 diabetes mellitus without complications: Secondary | ICD-10-CM | POA: Diagnosis not present

## 2017-01-16 DIAGNOSIS — I10 Essential (primary) hypertension: Secondary | ICD-10-CM | POA: Diagnosis not present

## 2017-01-16 DIAGNOSIS — M6281 Muscle weakness (generalized): Secondary | ICD-10-CM | POA: Diagnosis not present

## 2017-01-16 DIAGNOSIS — I5189 Other ill-defined heart diseases: Secondary | ICD-10-CM | POA: Diagnosis not present

## 2017-01-16 DIAGNOSIS — I638 Other cerebral infarction: Secondary | ICD-10-CM | POA: Diagnosis not present

## 2017-01-16 DIAGNOSIS — R278 Other lack of coordination: Secondary | ICD-10-CM | POA: Diagnosis not present

## 2017-01-17 DIAGNOSIS — I10 Essential (primary) hypertension: Secondary | ICD-10-CM | POA: Diagnosis not present

## 2017-01-17 DIAGNOSIS — Z7984 Long term (current) use of oral hypoglycemic drugs: Secondary | ICD-10-CM | POA: Diagnosis not present

## 2017-01-17 DIAGNOSIS — E119 Type 2 diabetes mellitus without complications: Secondary | ICD-10-CM | POA: Diagnosis not present

## 2017-01-17 DIAGNOSIS — M6281 Muscle weakness (generalized): Secondary | ICD-10-CM | POA: Diagnosis not present

## 2017-01-17 DIAGNOSIS — I69320 Aphasia following cerebral infarction: Secondary | ICD-10-CM | POA: Diagnosis not present

## 2017-01-17 DIAGNOSIS — R278 Other lack of coordination: Secondary | ICD-10-CM | POA: Diagnosis not present

## 2017-01-21 DIAGNOSIS — E119 Type 2 diabetes mellitus without complications: Secondary | ICD-10-CM | POA: Diagnosis not present

## 2017-01-21 DIAGNOSIS — M6281 Muscle weakness (generalized): Secondary | ICD-10-CM | POA: Diagnosis not present

## 2017-01-21 DIAGNOSIS — R278 Other lack of coordination: Secondary | ICD-10-CM | POA: Diagnosis not present

## 2017-01-21 DIAGNOSIS — I10 Essential (primary) hypertension: Secondary | ICD-10-CM | POA: Diagnosis not present

## 2017-01-21 DIAGNOSIS — Z7984 Long term (current) use of oral hypoglycemic drugs: Secondary | ICD-10-CM | POA: Diagnosis not present

## 2017-01-21 DIAGNOSIS — I69320 Aphasia following cerebral infarction: Secondary | ICD-10-CM | POA: Diagnosis not present

## 2017-01-23 DIAGNOSIS — I10 Essential (primary) hypertension: Secondary | ICD-10-CM | POA: Diagnosis not present

## 2017-01-23 DIAGNOSIS — I69320 Aphasia following cerebral infarction: Secondary | ICD-10-CM | POA: Diagnosis not present

## 2017-01-23 DIAGNOSIS — Z7984 Long term (current) use of oral hypoglycemic drugs: Secondary | ICD-10-CM | POA: Diagnosis not present

## 2017-01-23 DIAGNOSIS — E119 Type 2 diabetes mellitus without complications: Secondary | ICD-10-CM | POA: Diagnosis not present

## 2017-01-23 DIAGNOSIS — M6281 Muscle weakness (generalized): Secondary | ICD-10-CM | POA: Diagnosis not present

## 2017-01-23 DIAGNOSIS — R278 Other lack of coordination: Secondary | ICD-10-CM | POA: Diagnosis not present

## 2017-01-28 DIAGNOSIS — R278 Other lack of coordination: Secondary | ICD-10-CM | POA: Diagnosis not present

## 2017-01-28 DIAGNOSIS — Z7984 Long term (current) use of oral hypoglycemic drugs: Secondary | ICD-10-CM | POA: Diagnosis not present

## 2017-01-28 DIAGNOSIS — I10 Essential (primary) hypertension: Secondary | ICD-10-CM | POA: Diagnosis not present

## 2017-01-28 DIAGNOSIS — E119 Type 2 diabetes mellitus without complications: Secondary | ICD-10-CM | POA: Diagnosis not present

## 2017-01-28 DIAGNOSIS — M6281 Muscle weakness (generalized): Secondary | ICD-10-CM | POA: Diagnosis not present

## 2017-01-28 DIAGNOSIS — I69320 Aphasia following cerebral infarction: Secondary | ICD-10-CM | POA: Diagnosis not present

## 2017-01-30 DIAGNOSIS — R278 Other lack of coordination: Secondary | ICD-10-CM | POA: Diagnosis not present

## 2017-01-30 DIAGNOSIS — Z7984 Long term (current) use of oral hypoglycemic drugs: Secondary | ICD-10-CM | POA: Diagnosis not present

## 2017-01-30 DIAGNOSIS — I10 Essential (primary) hypertension: Secondary | ICD-10-CM | POA: Diagnosis not present

## 2017-01-30 DIAGNOSIS — I69320 Aphasia following cerebral infarction: Secondary | ICD-10-CM | POA: Diagnosis not present

## 2017-01-30 DIAGNOSIS — E119 Type 2 diabetes mellitus without complications: Secondary | ICD-10-CM | POA: Diagnosis not present

## 2017-01-30 DIAGNOSIS — M6281 Muscle weakness (generalized): Secondary | ICD-10-CM | POA: Diagnosis not present

## 2017-02-02 DIAGNOSIS — Z7984 Long term (current) use of oral hypoglycemic drugs: Secondary | ICD-10-CM | POA: Diagnosis not present

## 2017-02-02 DIAGNOSIS — M6281 Muscle weakness (generalized): Secondary | ICD-10-CM | POA: Diagnosis not present

## 2017-02-02 DIAGNOSIS — I69320 Aphasia following cerebral infarction: Secondary | ICD-10-CM | POA: Diagnosis not present

## 2017-02-02 DIAGNOSIS — R278 Other lack of coordination: Secondary | ICD-10-CM | POA: Diagnosis not present

## 2017-02-02 DIAGNOSIS — I10 Essential (primary) hypertension: Secondary | ICD-10-CM | POA: Diagnosis not present

## 2017-02-02 DIAGNOSIS — E119 Type 2 diabetes mellitus without complications: Secondary | ICD-10-CM | POA: Diagnosis not present

## 2017-02-04 DIAGNOSIS — R278 Other lack of coordination: Secondary | ICD-10-CM | POA: Diagnosis not present

## 2017-02-04 DIAGNOSIS — Z7984 Long term (current) use of oral hypoglycemic drugs: Secondary | ICD-10-CM | POA: Diagnosis not present

## 2017-02-04 DIAGNOSIS — I10 Essential (primary) hypertension: Secondary | ICD-10-CM | POA: Diagnosis not present

## 2017-02-04 DIAGNOSIS — M6281 Muscle weakness (generalized): Secondary | ICD-10-CM | POA: Diagnosis not present

## 2017-02-04 DIAGNOSIS — E119 Type 2 diabetes mellitus without complications: Secondary | ICD-10-CM | POA: Diagnosis not present

## 2017-02-04 DIAGNOSIS — I69320 Aphasia following cerebral infarction: Secondary | ICD-10-CM | POA: Diagnosis not present

## 2017-02-11 DIAGNOSIS — I10 Essential (primary) hypertension: Secondary | ICD-10-CM | POA: Diagnosis not present

## 2017-02-11 DIAGNOSIS — R278 Other lack of coordination: Secondary | ICD-10-CM | POA: Diagnosis not present

## 2017-02-11 DIAGNOSIS — E119 Type 2 diabetes mellitus without complications: Secondary | ICD-10-CM | POA: Diagnosis not present

## 2017-02-11 DIAGNOSIS — Z7984 Long term (current) use of oral hypoglycemic drugs: Secondary | ICD-10-CM | POA: Diagnosis not present

## 2017-02-11 DIAGNOSIS — I69320 Aphasia following cerebral infarction: Secondary | ICD-10-CM | POA: Diagnosis not present

## 2017-02-11 DIAGNOSIS — M6281 Muscle weakness (generalized): Secondary | ICD-10-CM | POA: Diagnosis not present

## 2017-02-13 DIAGNOSIS — Z7984 Long term (current) use of oral hypoglycemic drugs: Secondary | ICD-10-CM | POA: Diagnosis not present

## 2017-02-13 DIAGNOSIS — R278 Other lack of coordination: Secondary | ICD-10-CM | POA: Diagnosis not present

## 2017-02-13 DIAGNOSIS — I69320 Aphasia following cerebral infarction: Secondary | ICD-10-CM | POA: Diagnosis not present

## 2017-02-13 DIAGNOSIS — I10 Essential (primary) hypertension: Secondary | ICD-10-CM | POA: Diagnosis not present

## 2017-02-13 DIAGNOSIS — M6281 Muscle weakness (generalized): Secondary | ICD-10-CM | POA: Diagnosis not present

## 2017-02-13 DIAGNOSIS — E119 Type 2 diabetes mellitus without complications: Secondary | ICD-10-CM | POA: Diagnosis not present

## 2017-02-18 DIAGNOSIS — Z7984 Long term (current) use of oral hypoglycemic drugs: Secondary | ICD-10-CM | POA: Diagnosis not present

## 2017-02-18 DIAGNOSIS — I69320 Aphasia following cerebral infarction: Secondary | ICD-10-CM | POA: Diagnosis not present

## 2017-02-18 DIAGNOSIS — E119 Type 2 diabetes mellitus without complications: Secondary | ICD-10-CM | POA: Diagnosis not present

## 2017-02-18 DIAGNOSIS — M6281 Muscle weakness (generalized): Secondary | ICD-10-CM | POA: Diagnosis not present

## 2017-02-18 DIAGNOSIS — I10 Essential (primary) hypertension: Secondary | ICD-10-CM | POA: Diagnosis not present

## 2017-02-18 DIAGNOSIS — R278 Other lack of coordination: Secondary | ICD-10-CM | POA: Diagnosis not present

## 2017-02-20 DIAGNOSIS — I10 Essential (primary) hypertension: Secondary | ICD-10-CM | POA: Diagnosis not present

## 2017-02-20 DIAGNOSIS — Z0000 Encounter for general adult medical examination without abnormal findings: Secondary | ICD-10-CM | POA: Diagnosis not present

## 2017-02-20 DIAGNOSIS — E1165 Type 2 diabetes mellitus with hyperglycemia: Secondary | ICD-10-CM | POA: Diagnosis not present

## 2017-02-20 DIAGNOSIS — Z7984 Long term (current) use of oral hypoglycemic drugs: Secondary | ICD-10-CM | POA: Diagnosis not present

## 2017-02-20 DIAGNOSIS — I638 Other cerebral infarction: Secondary | ICD-10-CM | POA: Diagnosis not present

## 2017-02-20 DIAGNOSIS — E785 Hyperlipidemia, unspecified: Secondary | ICD-10-CM | POA: Diagnosis not present

## 2017-02-25 DIAGNOSIS — Z7984 Long term (current) use of oral hypoglycemic drugs: Secondary | ICD-10-CM | POA: Diagnosis not present

## 2017-02-25 DIAGNOSIS — I10 Essential (primary) hypertension: Secondary | ICD-10-CM | POA: Diagnosis not present

## 2017-02-25 DIAGNOSIS — E119 Type 2 diabetes mellitus without complications: Secondary | ICD-10-CM | POA: Diagnosis not present

## 2017-02-25 DIAGNOSIS — M6281 Muscle weakness (generalized): Secondary | ICD-10-CM | POA: Diagnosis not present

## 2017-02-25 DIAGNOSIS — I69320 Aphasia following cerebral infarction: Secondary | ICD-10-CM | POA: Diagnosis not present

## 2017-02-25 DIAGNOSIS — R278 Other lack of coordination: Secondary | ICD-10-CM | POA: Diagnosis not present

## 2017-03-06 DIAGNOSIS — E119 Type 2 diabetes mellitus without complications: Secondary | ICD-10-CM | POA: Diagnosis not present

## 2017-03-06 DIAGNOSIS — I69320 Aphasia following cerebral infarction: Secondary | ICD-10-CM | POA: Diagnosis not present

## 2017-03-06 DIAGNOSIS — Z7984 Long term (current) use of oral hypoglycemic drugs: Secondary | ICD-10-CM | POA: Diagnosis not present

## 2017-03-06 DIAGNOSIS — I10 Essential (primary) hypertension: Secondary | ICD-10-CM | POA: Diagnosis not present

## 2017-03-06 DIAGNOSIS — M6281 Muscle weakness (generalized): Secondary | ICD-10-CM | POA: Diagnosis not present

## 2017-03-06 DIAGNOSIS — R278 Other lack of coordination: Secondary | ICD-10-CM | POA: Diagnosis not present

## 2017-03-07 IMAGING — CR DG HIP (WITH OR WITHOUT PELVIS) 2-3V*R*
2 series · 2 of 2 positions shown · non-contrast
Comparison: None.

CLINICAL DATA: Right posterior low back pain and right hip pain
radiating to the knee, no injured

EXAM:
DG HIP (WITH OR WITHOUT PELVIS) 2-3V RIGHT

[w pelvis *]
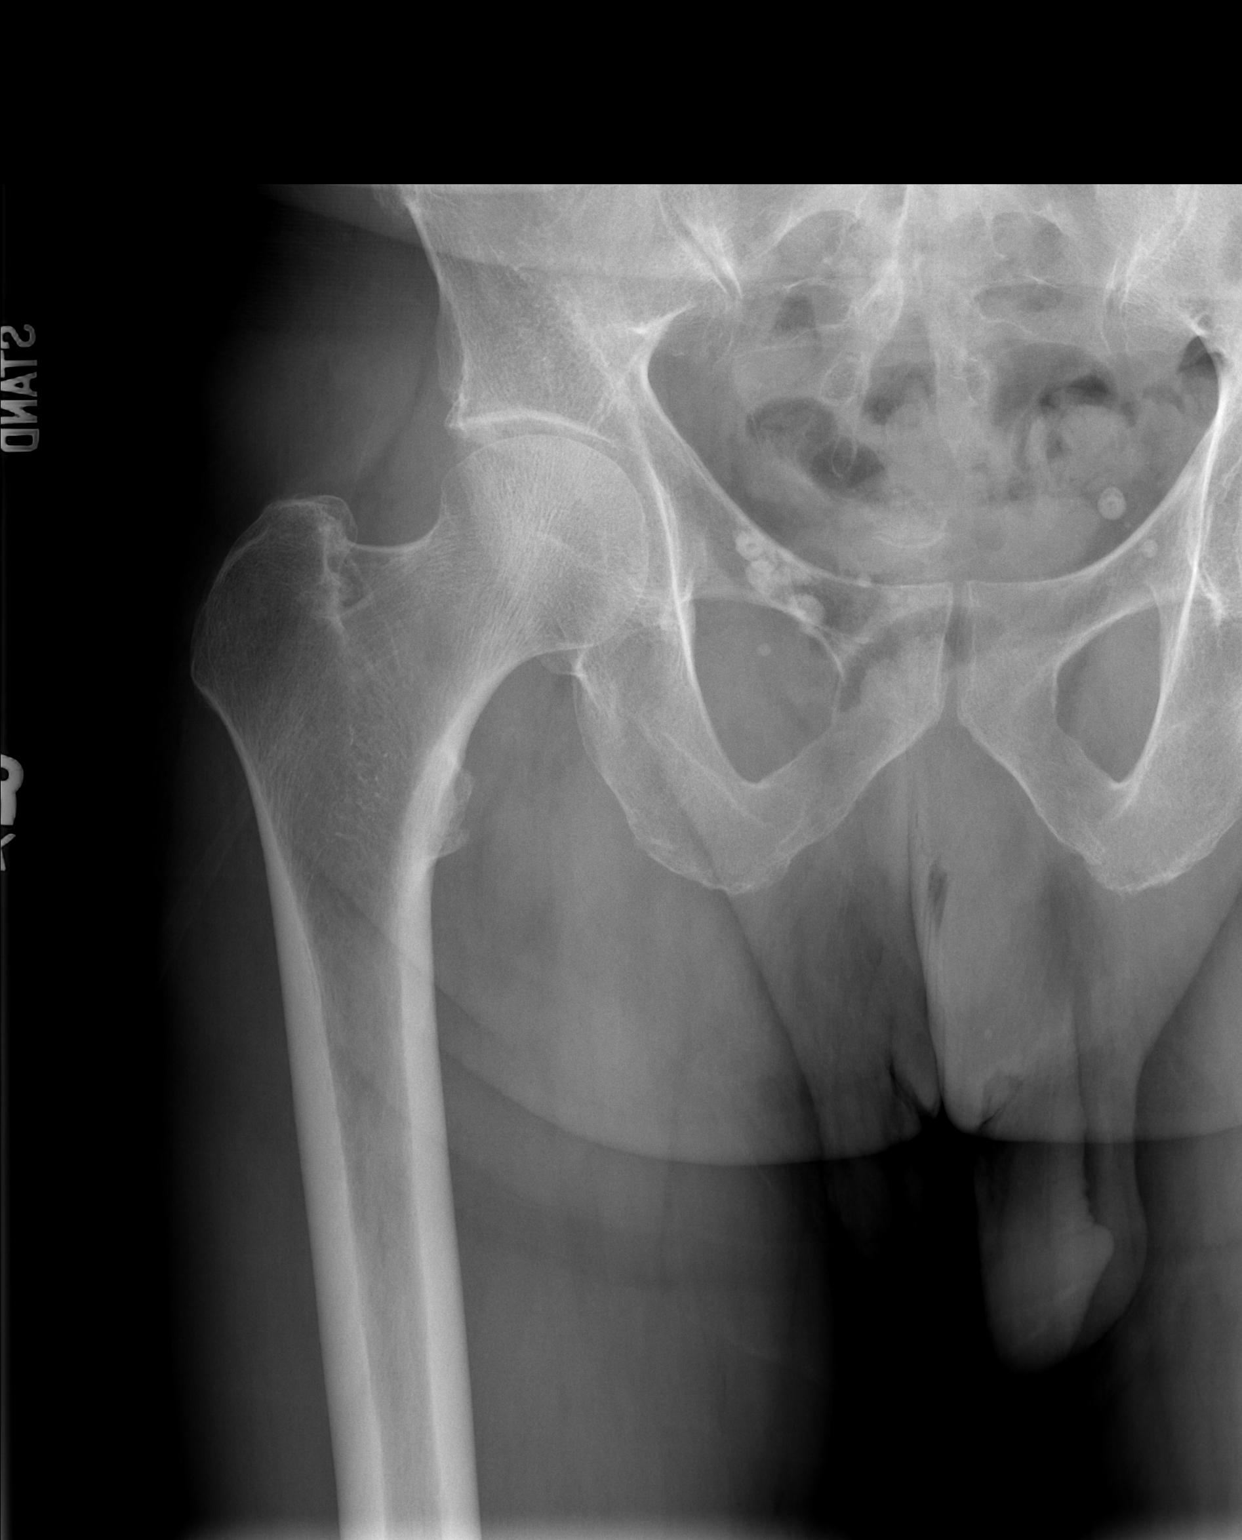

[t hip ap right]
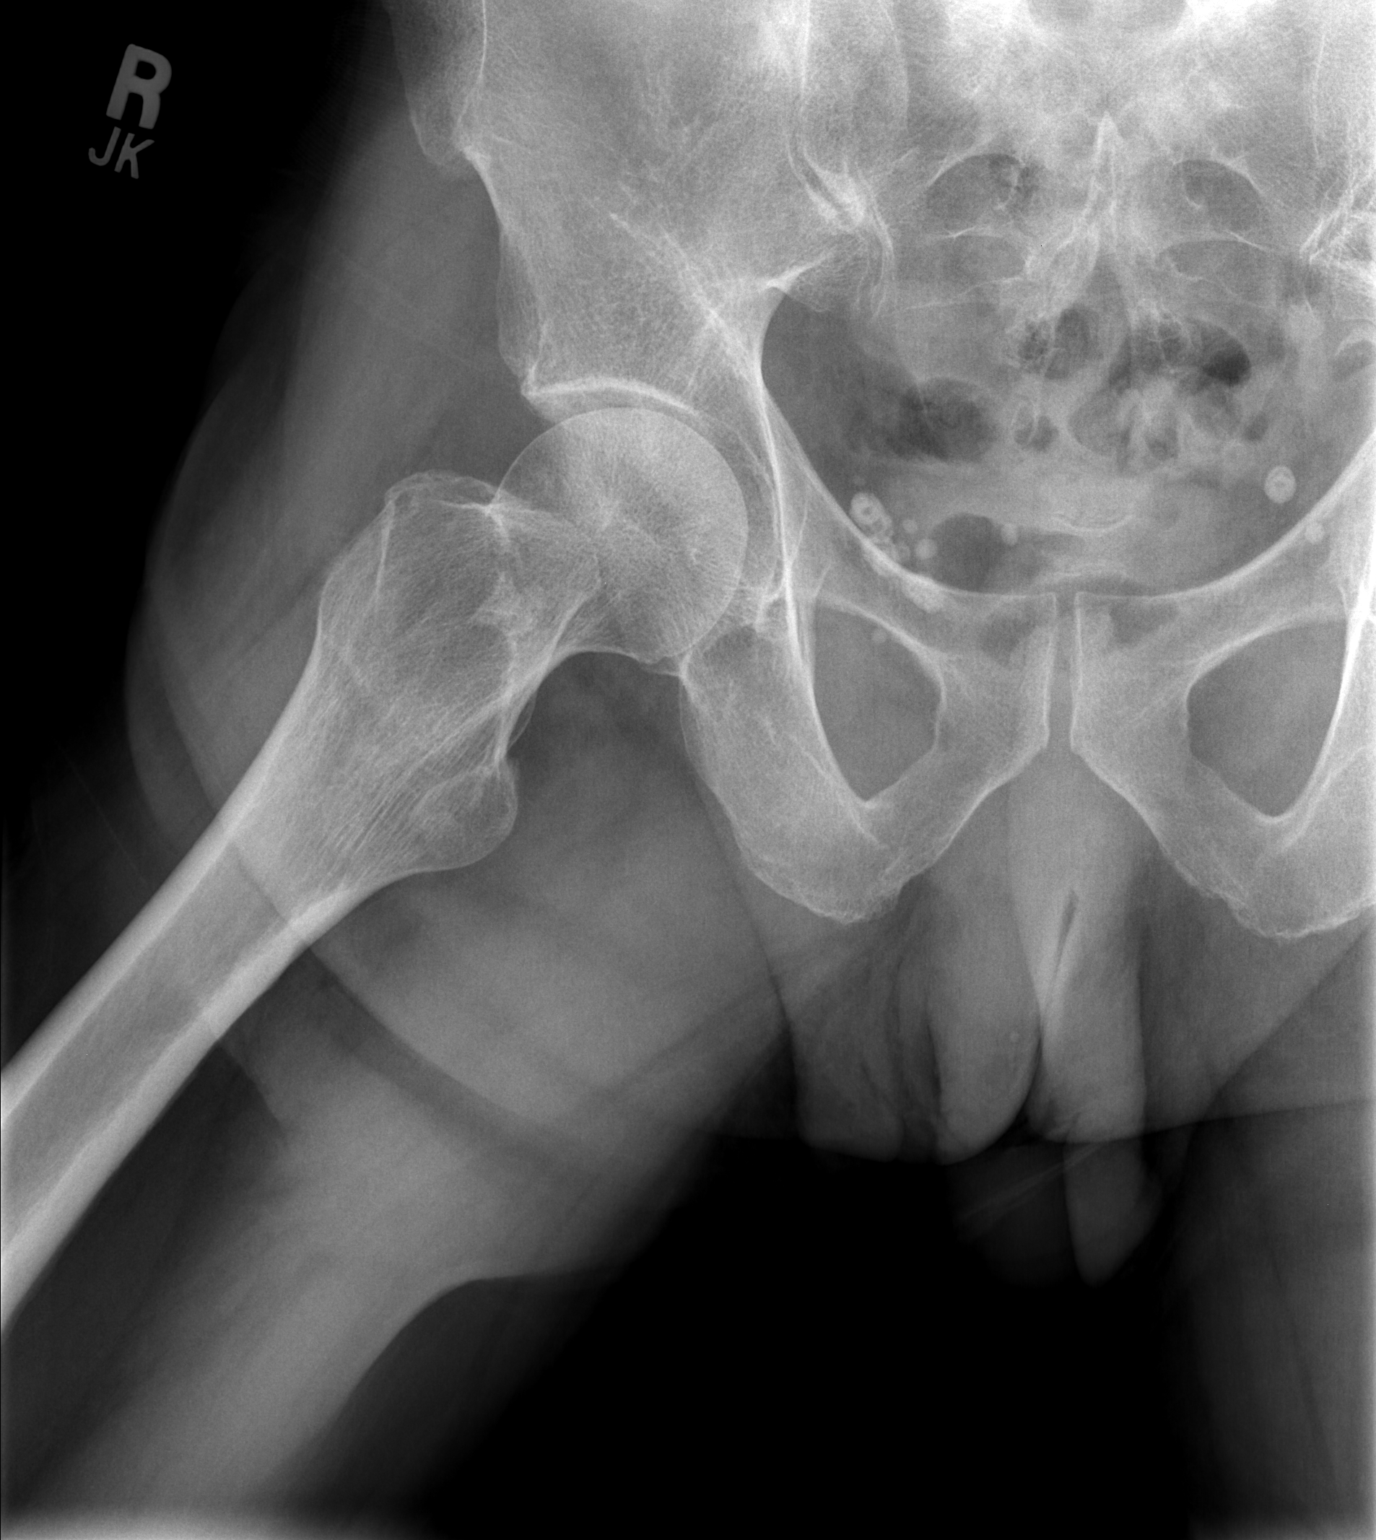

[2 of 2 positions shown; findings below may reference images not displayed]

FINDINGS: There is mild degenerative joint disease the right knee with some
loss of joint space medially. No fracture is seen. No significant
degenerative spurring is noted. The pelvic rami appear intact. The
SI joints are corticated.
IMPRESSION: Mild degenerative joint disease the right hip. No acute abnormality.

## 2017-03-07 IMAGING — CR DG LUMBAR SPINE 2-3V
3 series · 3 of 3 positions shown · non-contrast
Comparison: None.

CLINICAL DATA: Right posterior back pain for several months,
radiating to the right hip and to the knee, no injury

EXAM:
LUMBAR SPINE - 2-3 VIEW

[t l-spine a.p.]
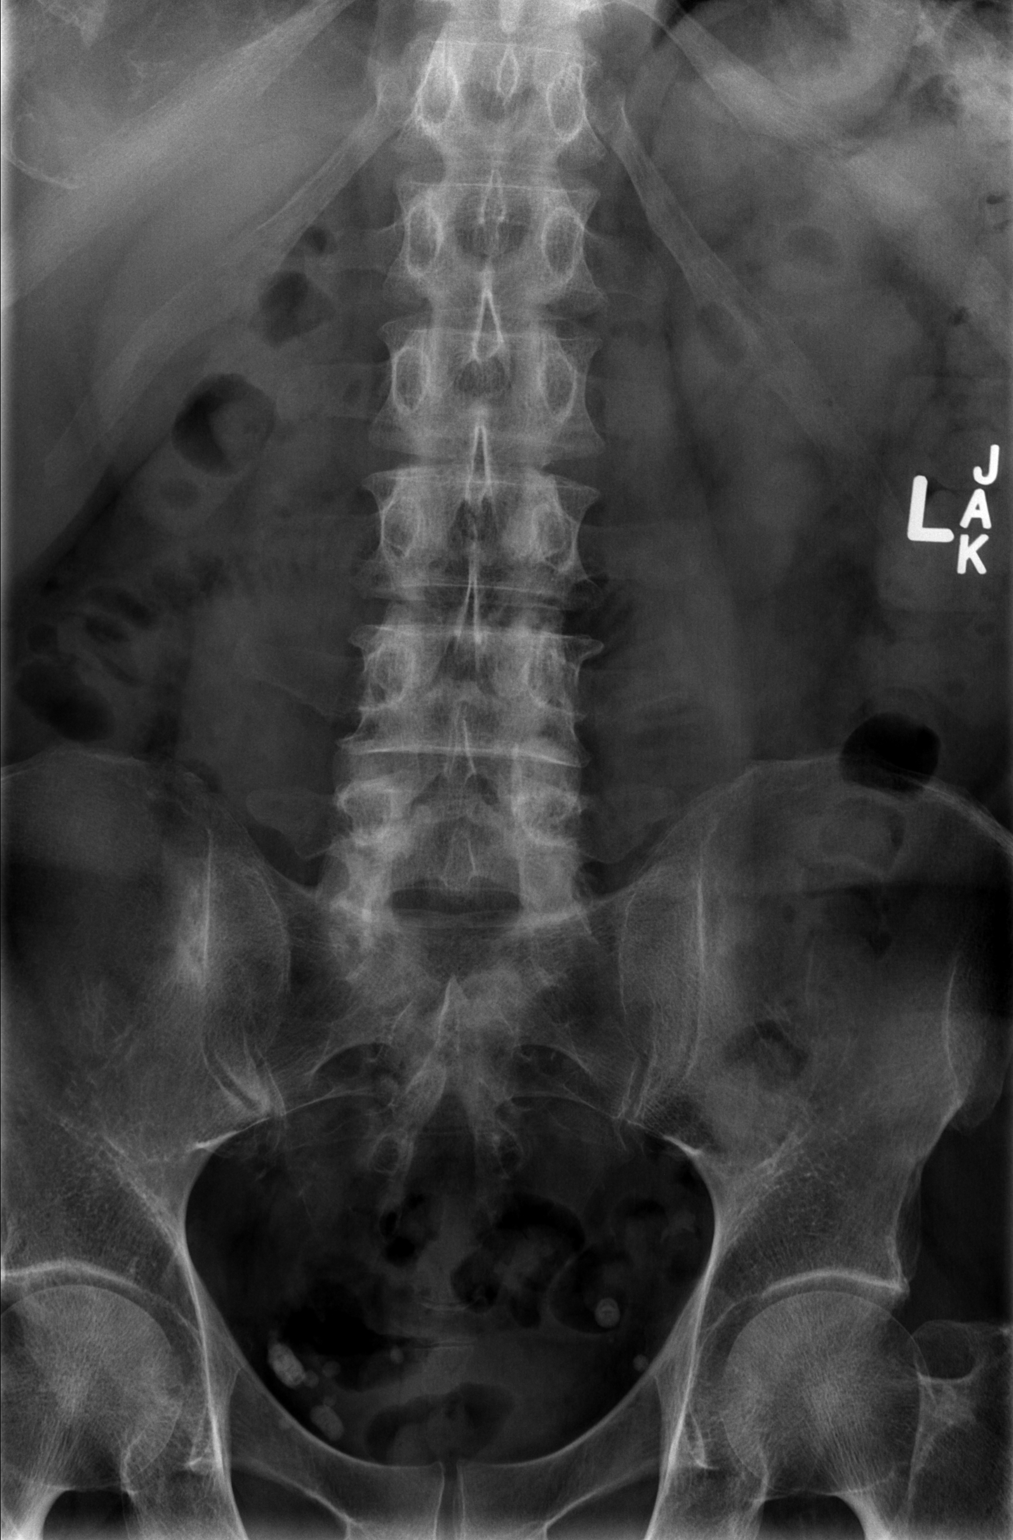

[t l-spine lat]
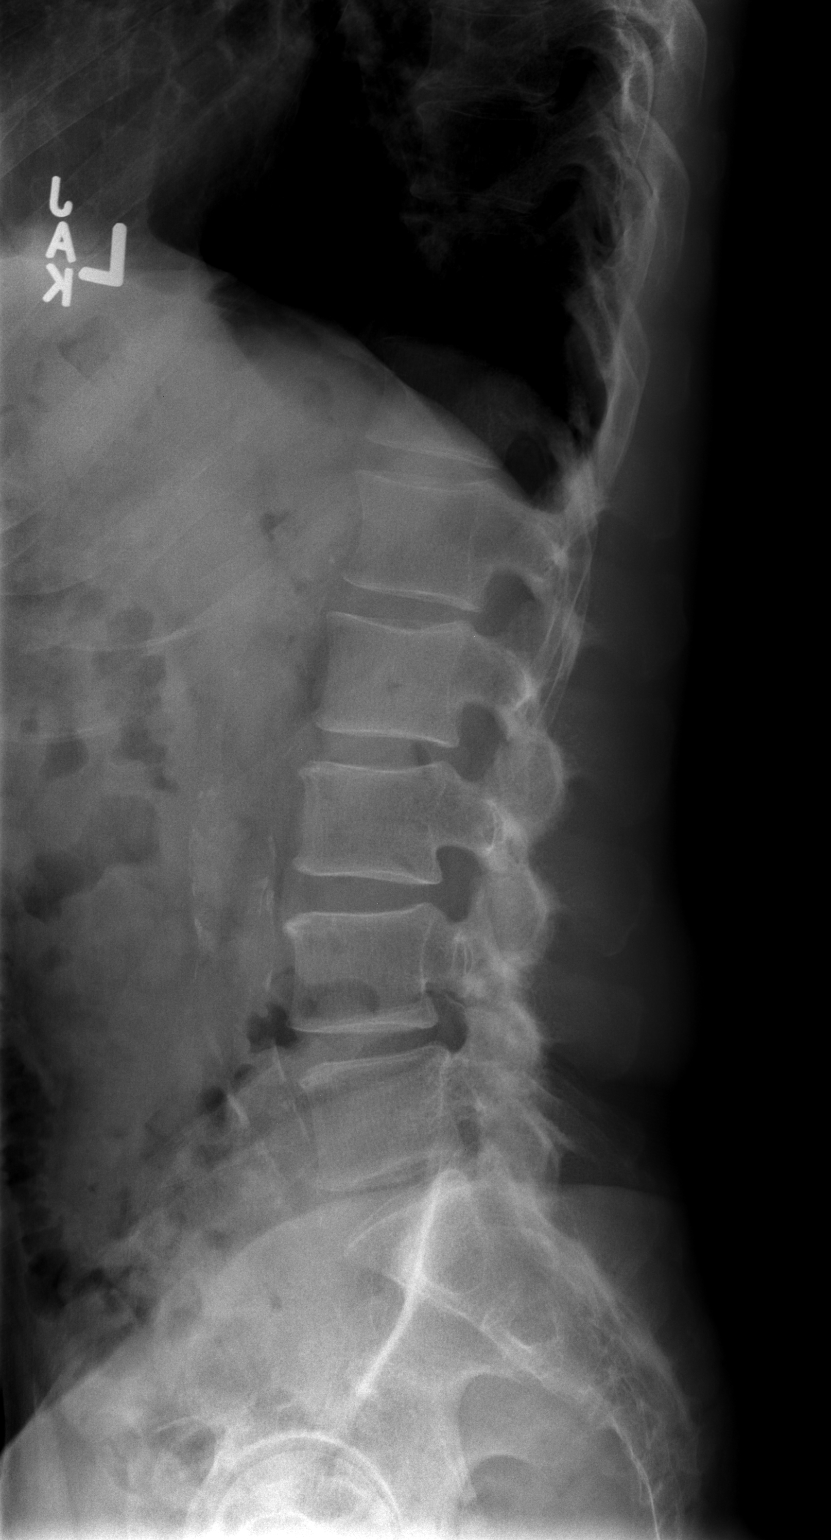

[t l-spine l5-s1 spot]
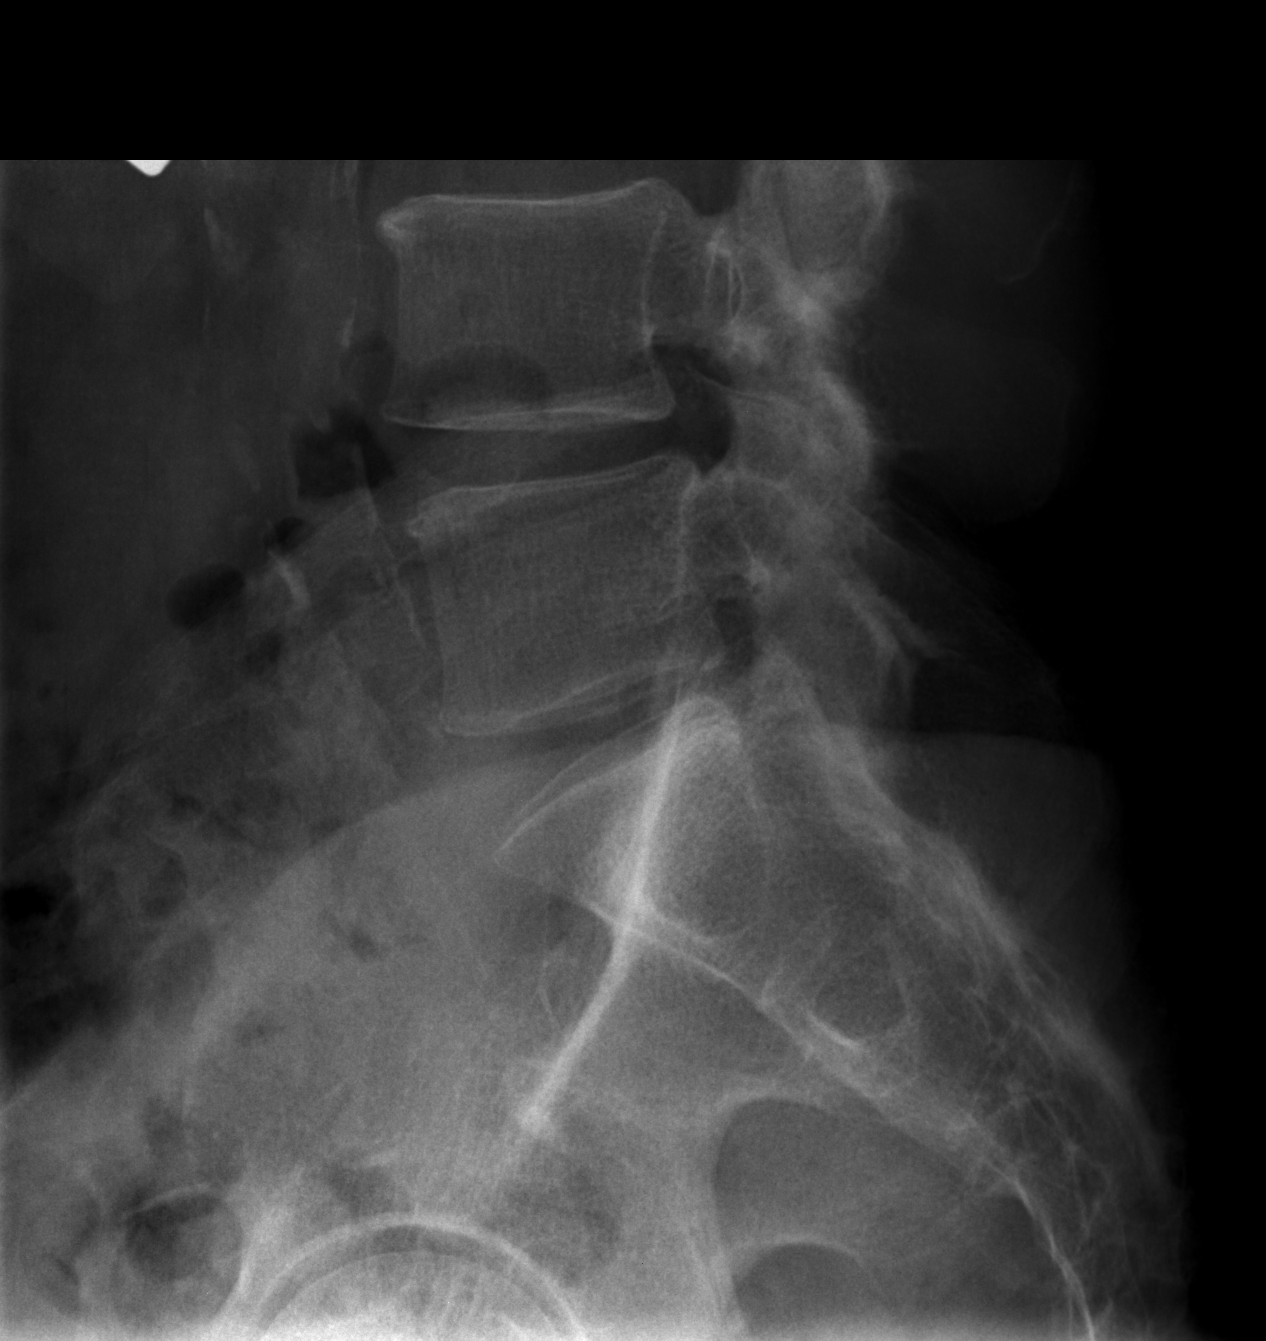

[3 of 3 positions shown; findings below may reference images not displayed]

FINDINGS: The lumbar vertebrae are in normal alignment. Mild degenerative disc
disease is noted at L4-5 with slight narrowing of disc space and
mild sclerosis with spurring. The remainder of intervertebral disc
spaces appear relatively normal. The SI joints appear corticated.
The bowel gas pattern is nonspecific.
IMPRESSION: 1. Mild degenerative disc disease at L4-5.
2. Normal alignment.

## 2017-03-11 DIAGNOSIS — M6281 Muscle weakness (generalized): Secondary | ICD-10-CM | POA: Diagnosis not present

## 2017-03-11 DIAGNOSIS — R278 Other lack of coordination: Secondary | ICD-10-CM | POA: Diagnosis not present

## 2017-03-11 DIAGNOSIS — I10 Essential (primary) hypertension: Secondary | ICD-10-CM | POA: Diagnosis not present

## 2017-03-11 DIAGNOSIS — Z7984 Long term (current) use of oral hypoglycemic drugs: Secondary | ICD-10-CM | POA: Diagnosis not present

## 2017-03-11 DIAGNOSIS — I69320 Aphasia following cerebral infarction: Secondary | ICD-10-CM | POA: Diagnosis not present

## 2017-03-11 DIAGNOSIS — E119 Type 2 diabetes mellitus without complications: Secondary | ICD-10-CM | POA: Diagnosis not present

## 2017-03-14 DIAGNOSIS — Z7984 Long term (current) use of oral hypoglycemic drugs: Secondary | ICD-10-CM | POA: Diagnosis not present

## 2017-03-14 DIAGNOSIS — E119 Type 2 diabetes mellitus without complications: Secondary | ICD-10-CM | POA: Diagnosis not present

## 2017-03-14 DIAGNOSIS — I10 Essential (primary) hypertension: Secondary | ICD-10-CM | POA: Diagnosis not present

## 2017-03-14 DIAGNOSIS — I69320 Aphasia following cerebral infarction: Secondary | ICD-10-CM | POA: Diagnosis not present

## 2017-03-14 DIAGNOSIS — Z9181 History of falling: Secondary | ICD-10-CM | POA: Diagnosis not present

## 2017-03-18 DIAGNOSIS — Z9181 History of falling: Secondary | ICD-10-CM | POA: Diagnosis not present

## 2017-03-18 DIAGNOSIS — I69320 Aphasia following cerebral infarction: Secondary | ICD-10-CM | POA: Diagnosis not present

## 2017-03-18 DIAGNOSIS — E119 Type 2 diabetes mellitus without complications: Secondary | ICD-10-CM | POA: Diagnosis not present

## 2017-03-18 DIAGNOSIS — I10 Essential (primary) hypertension: Secondary | ICD-10-CM | POA: Diagnosis not present

## 2017-03-18 DIAGNOSIS — Z7984 Long term (current) use of oral hypoglycemic drugs: Secondary | ICD-10-CM | POA: Diagnosis not present

## 2017-03-27 DIAGNOSIS — Z7984 Long term (current) use of oral hypoglycemic drugs: Secondary | ICD-10-CM | POA: Diagnosis not present

## 2017-03-27 DIAGNOSIS — I10 Essential (primary) hypertension: Secondary | ICD-10-CM | POA: Diagnosis not present

## 2017-03-27 DIAGNOSIS — Z9181 History of falling: Secondary | ICD-10-CM | POA: Diagnosis not present

## 2017-03-27 DIAGNOSIS — E119 Type 2 diabetes mellitus without complications: Secondary | ICD-10-CM | POA: Diagnosis not present

## 2017-03-27 DIAGNOSIS — I69320 Aphasia following cerebral infarction: Secondary | ICD-10-CM | POA: Diagnosis not present

## 2017-04-01 DIAGNOSIS — I10 Essential (primary) hypertension: Secondary | ICD-10-CM | POA: Diagnosis not present

## 2017-04-01 DIAGNOSIS — Z9181 History of falling: Secondary | ICD-10-CM | POA: Diagnosis not present

## 2017-04-01 DIAGNOSIS — Z7984 Long term (current) use of oral hypoglycemic drugs: Secondary | ICD-10-CM | POA: Diagnosis not present

## 2017-04-01 DIAGNOSIS — E119 Type 2 diabetes mellitus without complications: Secondary | ICD-10-CM | POA: Diagnosis not present

## 2017-04-01 DIAGNOSIS — I69320 Aphasia following cerebral infarction: Secondary | ICD-10-CM | POA: Diagnosis not present

## 2017-04-08 DIAGNOSIS — E119 Type 2 diabetes mellitus without complications: Secondary | ICD-10-CM | POA: Diagnosis not present

## 2017-04-08 DIAGNOSIS — I69320 Aphasia following cerebral infarction: Secondary | ICD-10-CM | POA: Diagnosis not present

## 2017-04-08 DIAGNOSIS — I10 Essential (primary) hypertension: Secondary | ICD-10-CM | POA: Diagnosis not present

## 2017-04-08 DIAGNOSIS — Z9181 History of falling: Secondary | ICD-10-CM | POA: Diagnosis not present

## 2017-04-08 DIAGNOSIS — Z7984 Long term (current) use of oral hypoglycemic drugs: Secondary | ICD-10-CM | POA: Diagnosis not present

## 2017-05-13 DIAGNOSIS — R351 Nocturia: Secondary | ICD-10-CM | POA: Diagnosis not present

## 2017-05-13 DIAGNOSIS — R350 Frequency of micturition: Secondary | ICD-10-CM | POA: Diagnosis not present

## 2017-05-13 DIAGNOSIS — E785 Hyperlipidemia, unspecified: Secondary | ICD-10-CM | POA: Diagnosis not present

## 2017-05-13 DIAGNOSIS — E1165 Type 2 diabetes mellitus with hyperglycemia: Secondary | ICD-10-CM | POA: Diagnosis not present

## 2017-05-13 DIAGNOSIS — M25511 Pain in right shoulder: Secondary | ICD-10-CM | POA: Diagnosis not present

## 2017-05-13 DIAGNOSIS — I10 Essential (primary) hypertension: Secondary | ICD-10-CM | POA: Diagnosis not present

## 2017-05-13 DIAGNOSIS — I6389 Other cerebral infarction: Secondary | ICD-10-CM | POA: Diagnosis not present

## 2017-06-03 DIAGNOSIS — I10 Essential (primary) hypertension: Secondary | ICD-10-CM | POA: Diagnosis not present

## 2017-06-03 DIAGNOSIS — I6930 Unspecified sequelae of cerebral infarction: Secondary | ICD-10-CM | POA: Diagnosis not present

## 2017-06-03 DIAGNOSIS — M25511 Pain in right shoulder: Secondary | ICD-10-CM | POA: Diagnosis not present

## 2017-06-03 DIAGNOSIS — Z8673 Personal history of transient ischemic attack (TIA), and cerebral infarction without residual deficits: Secondary | ICD-10-CM | POA: Diagnosis not present

## 2017-06-03 DIAGNOSIS — I639 Cerebral infarction, unspecified: Secondary | ICD-10-CM | POA: Diagnosis not present

## 2017-06-18 DIAGNOSIS — M7541 Impingement syndrome of right shoulder: Secondary | ICD-10-CM | POA: Diagnosis not present

## 2017-06-18 DIAGNOSIS — I639 Cerebral infarction, unspecified: Secondary | ICD-10-CM | POA: Diagnosis not present

## 2017-06-18 DIAGNOSIS — I10 Essential (primary) hypertension: Secondary | ICD-10-CM | POA: Diagnosis not present

## 2017-06-18 DIAGNOSIS — M7551 Bursitis of right shoulder: Secondary | ICD-10-CM | POA: Diagnosis not present

## 2017-06-18 DIAGNOSIS — R52 Pain, unspecified: Secondary | ICD-10-CM | POA: Diagnosis not present

## 2017-07-09 DIAGNOSIS — M6281 Muscle weakness (generalized): Secondary | ICD-10-CM | POA: Diagnosis not present

## 2017-07-09 DIAGNOSIS — M25511 Pain in right shoulder: Secondary | ICD-10-CM | POA: Diagnosis not present

## 2017-07-09 DIAGNOSIS — M25611 Stiffness of right shoulder, not elsewhere classified: Secondary | ICD-10-CM | POA: Diagnosis not present

## 2017-07-09 DIAGNOSIS — M7541 Impingement syndrome of right shoulder: Secondary | ICD-10-CM | POA: Diagnosis not present

## 2017-07-09 DIAGNOSIS — Z8673 Personal history of transient ischemic attack (TIA), and cerebral infarction without residual deficits: Secondary | ICD-10-CM | POA: Diagnosis not present

## 2017-07-23 DIAGNOSIS — M25511 Pain in right shoulder: Secondary | ICD-10-CM | POA: Diagnosis not present

## 2017-07-23 DIAGNOSIS — Z8673 Personal history of transient ischemic attack (TIA), and cerebral infarction without residual deficits: Secondary | ICD-10-CM | POA: Diagnosis not present

## 2017-07-23 DIAGNOSIS — M25611 Stiffness of right shoulder, not elsewhere classified: Secondary | ICD-10-CM | POA: Diagnosis not present

## 2017-07-23 DIAGNOSIS — M7541 Impingement syndrome of right shoulder: Secondary | ICD-10-CM | POA: Diagnosis not present

## 2017-07-23 DIAGNOSIS — M6281 Muscle weakness (generalized): Secondary | ICD-10-CM | POA: Diagnosis not present

## 2017-07-28 DIAGNOSIS — M7541 Impingement syndrome of right shoulder: Secondary | ICD-10-CM | POA: Diagnosis not present

## 2017-07-28 DIAGNOSIS — M6281 Muscle weakness (generalized): Secondary | ICD-10-CM | POA: Diagnosis not present

## 2017-07-30 DIAGNOSIS — M7541 Impingement syndrome of right shoulder: Secondary | ICD-10-CM | POA: Diagnosis not present

## 2017-07-30 DIAGNOSIS — M6281 Muscle weakness (generalized): Secondary | ICD-10-CM | POA: Diagnosis not present

## 2017-08-04 DIAGNOSIS — M7541 Impingement syndrome of right shoulder: Secondary | ICD-10-CM | POA: Diagnosis not present

## 2017-08-04 DIAGNOSIS — M6281 Muscle weakness (generalized): Secondary | ICD-10-CM | POA: Diagnosis not present

## 2017-08-06 DIAGNOSIS — M6281 Muscle weakness (generalized): Secondary | ICD-10-CM | POA: Diagnosis not present

## 2017-08-06 DIAGNOSIS — M7541 Impingement syndrome of right shoulder: Secondary | ICD-10-CM | POA: Diagnosis not present

## 2017-09-02 DIAGNOSIS — Z794 Long term (current) use of insulin: Secondary | ICD-10-CM | POA: Diagnosis not present

## 2017-09-02 DIAGNOSIS — I10 Essential (primary) hypertension: Secondary | ICD-10-CM | POA: Diagnosis not present

## 2017-09-02 DIAGNOSIS — E1165 Type 2 diabetes mellitus with hyperglycemia: Secondary | ICD-10-CM | POA: Diagnosis not present

## 2017-09-02 DIAGNOSIS — I6389 Other cerebral infarction: Secondary | ICD-10-CM | POA: Diagnosis not present

## 2017-09-02 DIAGNOSIS — Z7984 Long term (current) use of oral hypoglycemic drugs: Secondary | ICD-10-CM | POA: Diagnosis not present

## 2017-09-02 DIAGNOSIS — E785 Hyperlipidemia, unspecified: Secondary | ICD-10-CM | POA: Diagnosis not present

## 2017-12-02 DIAGNOSIS — Z8673 Personal history of transient ischemic attack (TIA), and cerebral infarction without residual deficits: Secondary | ICD-10-CM | POA: Diagnosis not present

## 2017-12-02 DIAGNOSIS — E118 Type 2 diabetes mellitus with unspecified complications: Secondary | ICD-10-CM | POA: Diagnosis not present

## 2017-12-11 DIAGNOSIS — E1165 Type 2 diabetes mellitus with hyperglycemia: Secondary | ICD-10-CM | POA: Diagnosis not present

## 2017-12-11 DIAGNOSIS — Z7984 Long term (current) use of oral hypoglycemic drugs: Secondary | ICD-10-CM | POA: Diagnosis not present

## 2017-12-11 DIAGNOSIS — I6389 Other cerebral infarction: Secondary | ICD-10-CM | POA: Diagnosis not present

## 2018-03-18 DIAGNOSIS — Z0000 Encounter for general adult medical examination without abnormal findings: Secondary | ICD-10-CM | POA: Diagnosis not present

## 2018-03-18 DIAGNOSIS — I6389 Other cerebral infarction: Secondary | ICD-10-CM | POA: Diagnosis not present

## 2018-03-18 DIAGNOSIS — E785 Hyperlipidemia, unspecified: Secondary | ICD-10-CM | POA: Diagnosis not present

## 2018-03-18 DIAGNOSIS — I10 Essential (primary) hypertension: Secondary | ICD-10-CM | POA: Diagnosis not present

## 2018-03-18 DIAGNOSIS — E1165 Type 2 diabetes mellitus with hyperglycemia: Secondary | ICD-10-CM | POA: Diagnosis not present

## 2018-03-18 DIAGNOSIS — Z7984 Long term (current) use of oral hypoglycemic drugs: Secondary | ICD-10-CM | POA: Diagnosis not present

## 2018-04-15 DIAGNOSIS — Z0000 Encounter for general adult medical examination without abnormal findings: Secondary | ICD-10-CM | POA: Diagnosis not present

## 2018-04-15 DIAGNOSIS — I6389 Other cerebral infarction: Secondary | ICD-10-CM | POA: Diagnosis not present

## 2018-04-15 DIAGNOSIS — E1165 Type 2 diabetes mellitus with hyperglycemia: Secondary | ICD-10-CM | POA: Diagnosis not present

## 2018-04-15 DIAGNOSIS — Z7984 Long term (current) use of oral hypoglycemic drugs: Secondary | ICD-10-CM | POA: Diagnosis not present

## 2018-04-15 DIAGNOSIS — I10 Essential (primary) hypertension: Secondary | ICD-10-CM | POA: Diagnosis not present

## 2018-04-15 DIAGNOSIS — E785 Hyperlipidemia, unspecified: Secondary | ICD-10-CM | POA: Diagnosis not present

## 2018-04-30 DIAGNOSIS — E1165 Type 2 diabetes mellitus with hyperglycemia: Secondary | ICD-10-CM | POA: Diagnosis not present

## 2018-04-30 DIAGNOSIS — Z7984 Long term (current) use of oral hypoglycemic drugs: Secondary | ICD-10-CM | POA: Diagnosis not present

## 2018-04-30 DIAGNOSIS — Z23 Encounter for immunization: Secondary | ICD-10-CM | POA: Diagnosis not present

## 2018-04-30 DIAGNOSIS — E785 Hyperlipidemia, unspecified: Secondary | ICD-10-CM | POA: Diagnosis not present

## 2018-07-15 DIAGNOSIS — I10 Essential (primary) hypertension: Secondary | ICD-10-CM | POA: Diagnosis not present

## 2018-07-15 DIAGNOSIS — E119 Type 2 diabetes mellitus without complications: Secondary | ICD-10-CM | POA: Diagnosis not present

## 2018-07-15 DIAGNOSIS — Z23 Encounter for immunization: Secondary | ICD-10-CM | POA: Diagnosis not present

## 2018-07-29 DIAGNOSIS — H25812 Combined forms of age-related cataract, left eye: Secondary | ICD-10-CM | POA: Diagnosis not present

## 2018-07-29 DIAGNOSIS — H2511 Age-related nuclear cataract, right eye: Secondary | ICD-10-CM | POA: Diagnosis not present

## 2018-07-29 DIAGNOSIS — E109 Type 1 diabetes mellitus without complications: Secondary | ICD-10-CM | POA: Diagnosis not present

## 2018-10-14 DIAGNOSIS — R194 Change in bowel habit: Secondary | ICD-10-CM | POA: Diagnosis not present

## 2018-10-14 DIAGNOSIS — E785 Hyperlipidemia, unspecified: Secondary | ICD-10-CM | POA: Diagnosis not present

## 2018-10-14 DIAGNOSIS — E1165 Type 2 diabetes mellitus with hyperglycemia: Secondary | ICD-10-CM | POA: Diagnosis not present

## 2018-10-14 DIAGNOSIS — I10 Essential (primary) hypertension: Secondary | ICD-10-CM | POA: Diagnosis not present

## 2019-01-13 DIAGNOSIS — I10 Essential (primary) hypertension: Secondary | ICD-10-CM | POA: Diagnosis not present

## 2019-01-13 DIAGNOSIS — E119 Type 2 diabetes mellitus without complications: Secondary | ICD-10-CM | POA: Diagnosis not present

## 2019-01-13 DIAGNOSIS — Z125 Encounter for screening for malignant neoplasm of prostate: Secondary | ICD-10-CM | POA: Diagnosis not present

## 2019-01-13 DIAGNOSIS — Z1321 Encounter for screening for nutritional disorder: Secondary | ICD-10-CM | POA: Diagnosis not present

## 2019-02-02 DIAGNOSIS — I10 Essential (primary) hypertension: Secondary | ICD-10-CM | POA: Diagnosis not present

## 2019-02-02 DIAGNOSIS — E119 Type 2 diabetes mellitus without complications: Secondary | ICD-10-CM | POA: Diagnosis not present

## 2019-02-02 DIAGNOSIS — Z1321 Encounter for screening for nutritional disorder: Secondary | ICD-10-CM | POA: Diagnosis not present

## 2019-02-02 DIAGNOSIS — Z125 Encounter for screening for malignant neoplasm of prostate: Secondary | ICD-10-CM | POA: Diagnosis not present

## 2019-02-09 DIAGNOSIS — H25812 Combined forms of age-related cataract, left eye: Secondary | ICD-10-CM | POA: Diagnosis not present

## 2019-02-09 DIAGNOSIS — H40013 Open angle with borderline findings, low risk, bilateral: Secondary | ICD-10-CM | POA: Diagnosis not present

## 2019-02-09 DIAGNOSIS — E109 Type 1 diabetes mellitus without complications: Secondary | ICD-10-CM | POA: Diagnosis not present

## 2019-02-09 DIAGNOSIS — H2511 Age-related nuclear cataract, right eye: Secondary | ICD-10-CM | POA: Diagnosis not present

## 2019-02-15 DIAGNOSIS — H2511 Age-related nuclear cataract, right eye: Secondary | ICD-10-CM | POA: Diagnosis not present

## 2019-03-08 DIAGNOSIS — H5703 Miosis: Secondary | ICD-10-CM | POA: Diagnosis not present

## 2019-03-08 DIAGNOSIS — H21562 Pupillary abnormality, left eye: Secondary | ICD-10-CM | POA: Diagnosis not present

## 2019-03-08 DIAGNOSIS — H2512 Age-related nuclear cataract, left eye: Secondary | ICD-10-CM | POA: Diagnosis not present

## 2019-03-08 DIAGNOSIS — H25812 Combined forms of age-related cataract, left eye: Secondary | ICD-10-CM | POA: Diagnosis not present

## 2019-03-08 DIAGNOSIS — H2181 Floppy iris syndrome: Secondary | ICD-10-CM | POA: Diagnosis not present

## 2020-02-17 ENCOUNTER — Other Ambulatory Visit: Payer: Self-pay | Admitting: *Deleted

## 2020-02-17 DIAGNOSIS — R002 Palpitations: Secondary | ICD-10-CM

## 2020-02-18 ENCOUNTER — Telehealth: Payer: Self-pay | Admitting: Radiology

## 2020-02-18 NOTE — Telephone Encounter (Signed)
Enrolled patient for a 14 day [employer] XT monitor to be mailed to patients home. Brief instructions were gone over with patient and he knows to expect the monitor to arrive in 2-3 days.

## 2020-03-02 ENCOUNTER — Other Ambulatory Visit (INDEPENDENT_AMBULATORY_CARE_PROVIDER_SITE_OTHER): Payer: Medicare Other

## 2020-03-02 DIAGNOSIS — R002 Palpitations: Secondary | ICD-10-CM

## 2020-05-01 DIAGNOSIS — M6281 Muscle weakness (generalized): Secondary | ICD-10-CM | POA: Diagnosis not present

## 2020-05-01 DIAGNOSIS — R262 Difficulty in walking, not elsewhere classified: Secondary | ICD-10-CM | POA: Diagnosis not present

## 2020-05-03 DIAGNOSIS — R262 Difficulty in walking, not elsewhere classified: Secondary | ICD-10-CM | POA: Diagnosis not present

## 2020-05-03 DIAGNOSIS — M6281 Muscle weakness (generalized): Secondary | ICD-10-CM | POA: Diagnosis not present

## 2020-05-16 DIAGNOSIS — M6281 Muscle weakness (generalized): Secondary | ICD-10-CM | POA: Diagnosis not present

## 2020-05-16 DIAGNOSIS — R262 Difficulty in walking, not elsewhere classified: Secondary | ICD-10-CM | POA: Diagnosis not present

## 2020-05-24 DIAGNOSIS — M6281 Muscle weakness (generalized): Secondary | ICD-10-CM | POA: Diagnosis not present

## 2020-05-24 DIAGNOSIS — R262 Difficulty in walking, not elsewhere classified: Secondary | ICD-10-CM | POA: Diagnosis not present

## 2020-06-28 DIAGNOSIS — R262 Difficulty in walking, not elsewhere classified: Secondary | ICD-10-CM | POA: Diagnosis not present

## 2020-06-28 DIAGNOSIS — M6281 Muscle weakness (generalized): Secondary | ICD-10-CM | POA: Diagnosis not present

## 2020-07-19 DIAGNOSIS — H538 Other visual disturbances: Secondary | ICD-10-CM | POA: Diagnosis not present

## 2020-08-20 DIAGNOSIS — I10 Essential (primary) hypertension: Secondary | ICD-10-CM | POA: Diagnosis not present

## 2020-08-20 DIAGNOSIS — I639 Cerebral infarction, unspecified: Secondary | ICD-10-CM | POA: Diagnosis not present

## 2020-08-20 DIAGNOSIS — E119 Type 2 diabetes mellitus without complications: Secondary | ICD-10-CM | POA: Diagnosis not present

## 2020-08-20 DIAGNOSIS — E1165 Type 2 diabetes mellitus with hyperglycemia: Secondary | ICD-10-CM | POA: Diagnosis not present

## 2020-08-20 DIAGNOSIS — F4321 Adjustment disorder with depressed mood: Secondary | ICD-10-CM | POA: Diagnosis not present

## 2020-08-20 DIAGNOSIS — E785 Hyperlipidemia, unspecified: Secondary | ICD-10-CM | POA: Diagnosis not present

## 2020-08-23 DIAGNOSIS — H40023 Open angle with borderline findings, high risk, bilateral: Secondary | ICD-10-CM | POA: Diagnosis not present

## 2020-09-27 DIAGNOSIS — M545 Low back pain, unspecified: Secondary | ICD-10-CM | POA: Diagnosis not present

## 2020-09-27 DIAGNOSIS — R2689 Other abnormalities of gait and mobility: Secondary | ICD-10-CM | POA: Diagnosis not present

## 2020-09-27 DIAGNOSIS — M6281 Muscle weakness (generalized): Secondary | ICD-10-CM | POA: Diagnosis not present

## 2020-10-04 DIAGNOSIS — M545 Low back pain, unspecified: Secondary | ICD-10-CM | POA: Diagnosis not present

## 2020-10-04 DIAGNOSIS — M6281 Muscle weakness (generalized): Secondary | ICD-10-CM | POA: Diagnosis not present

## 2020-10-04 DIAGNOSIS — R2689 Other abnormalities of gait and mobility: Secondary | ICD-10-CM | POA: Diagnosis not present

## 2020-10-11 DIAGNOSIS — M6281 Muscle weakness (generalized): Secondary | ICD-10-CM | POA: Diagnosis not present

## 2020-10-11 DIAGNOSIS — M545 Low back pain, unspecified: Secondary | ICD-10-CM | POA: Diagnosis not present

## 2020-10-11 DIAGNOSIS — R2689 Other abnormalities of gait and mobility: Secondary | ICD-10-CM | POA: Diagnosis not present

## 2020-10-18 DIAGNOSIS — M545 Low back pain, unspecified: Secondary | ICD-10-CM | POA: Diagnosis not present

## 2020-10-18 DIAGNOSIS — R2689 Other abnormalities of gait and mobility: Secondary | ICD-10-CM | POA: Diagnosis not present

## 2020-10-18 DIAGNOSIS — M6281 Muscle weakness (generalized): Secondary | ICD-10-CM | POA: Diagnosis not present

## 2020-10-19 DIAGNOSIS — M545 Low back pain, unspecified: Secondary | ICD-10-CM | POA: Diagnosis not present

## 2020-10-19 DIAGNOSIS — M6281 Muscle weakness (generalized): Secondary | ICD-10-CM | POA: Diagnosis not present

## 2020-10-19 DIAGNOSIS — R2689 Other abnormalities of gait and mobility: Secondary | ICD-10-CM | POA: Diagnosis not present

## 2020-10-25 DIAGNOSIS — M6281 Muscle weakness (generalized): Secondary | ICD-10-CM | POA: Diagnosis not present

## 2020-10-25 DIAGNOSIS — M545 Low back pain, unspecified: Secondary | ICD-10-CM | POA: Diagnosis not present

## 2020-10-25 DIAGNOSIS — R2689 Other abnormalities of gait and mobility: Secondary | ICD-10-CM | POA: Diagnosis not present

## 2020-11-01 DIAGNOSIS — R2689 Other abnormalities of gait and mobility: Secondary | ICD-10-CM | POA: Diagnosis not present

## 2020-11-01 DIAGNOSIS — M6281 Muscle weakness (generalized): Secondary | ICD-10-CM | POA: Diagnosis not present

## 2020-11-01 DIAGNOSIS — M545 Low back pain, unspecified: Secondary | ICD-10-CM | POA: Diagnosis not present

## 2020-11-02 DIAGNOSIS — M6281 Muscle weakness (generalized): Secondary | ICD-10-CM | POA: Diagnosis not present

## 2020-11-02 DIAGNOSIS — R2689 Other abnormalities of gait and mobility: Secondary | ICD-10-CM | POA: Diagnosis not present

## 2020-11-02 DIAGNOSIS — M545 Low back pain, unspecified: Secondary | ICD-10-CM | POA: Diagnosis not present

## 2020-11-08 DIAGNOSIS — M545 Low back pain, unspecified: Secondary | ICD-10-CM | POA: Diagnosis not present

## 2020-11-08 DIAGNOSIS — R2689 Other abnormalities of gait and mobility: Secondary | ICD-10-CM | POA: Diagnosis not present

## 2020-11-08 DIAGNOSIS — M6281 Muscle weakness (generalized): Secondary | ICD-10-CM | POA: Diagnosis not present

## 2020-11-14 DIAGNOSIS — M6281 Muscle weakness (generalized): Secondary | ICD-10-CM | POA: Diagnosis not present

## 2020-11-14 DIAGNOSIS — R2689 Other abnormalities of gait and mobility: Secondary | ICD-10-CM | POA: Diagnosis not present

## 2020-11-14 DIAGNOSIS — M545 Low back pain, unspecified: Secondary | ICD-10-CM | POA: Diagnosis not present

## 2020-11-16 DIAGNOSIS — R2689 Other abnormalities of gait and mobility: Secondary | ICD-10-CM | POA: Diagnosis not present

## 2020-11-16 DIAGNOSIS — M6281 Muscle weakness (generalized): Secondary | ICD-10-CM | POA: Diagnosis not present

## 2020-11-16 DIAGNOSIS — M545 Low back pain, unspecified: Secondary | ICD-10-CM | POA: Diagnosis not present

## 2020-11-17 DIAGNOSIS — Z23 Encounter for immunization: Secondary | ICD-10-CM | POA: Diagnosis not present

## 2020-11-22 DIAGNOSIS — R2689 Other abnormalities of gait and mobility: Secondary | ICD-10-CM | POA: Diagnosis not present

## 2020-11-22 DIAGNOSIS — M6281 Muscle weakness (generalized): Secondary | ICD-10-CM | POA: Diagnosis not present

## 2020-11-22 DIAGNOSIS — M545 Low back pain, unspecified: Secondary | ICD-10-CM | POA: Diagnosis not present

## 2020-11-28 DIAGNOSIS — R2689 Other abnormalities of gait and mobility: Secondary | ICD-10-CM | POA: Diagnosis not present

## 2020-11-28 DIAGNOSIS — M6281 Muscle weakness (generalized): Secondary | ICD-10-CM | POA: Diagnosis not present

## 2020-11-28 DIAGNOSIS — M545 Low back pain, unspecified: Secondary | ICD-10-CM | POA: Diagnosis not present

## 2020-11-29 DIAGNOSIS — F4321 Adjustment disorder with depressed mood: Secondary | ICD-10-CM | POA: Diagnosis not present

## 2020-11-29 DIAGNOSIS — Z0000 Encounter for general adult medical examination without abnormal findings: Secondary | ICD-10-CM | POA: Diagnosis not present

## 2020-11-29 DIAGNOSIS — Z125 Encounter for screening for malignant neoplasm of prostate: Secondary | ICD-10-CM | POA: Diagnosis not present

## 2020-11-29 DIAGNOSIS — E785 Hyperlipidemia, unspecified: Secondary | ICD-10-CM | POA: Diagnosis not present

## 2020-11-29 DIAGNOSIS — I639 Cerebral infarction, unspecified: Secondary | ICD-10-CM | POA: Diagnosis not present

## 2020-11-29 DIAGNOSIS — Z7984 Long term (current) use of oral hypoglycemic drugs: Secondary | ICD-10-CM | POA: Diagnosis not present

## 2020-11-29 DIAGNOSIS — I10 Essential (primary) hypertension: Secondary | ICD-10-CM | POA: Diagnosis not present

## 2020-11-29 DIAGNOSIS — R269 Unspecified abnormalities of gait and mobility: Secondary | ICD-10-CM | POA: Diagnosis not present

## 2020-11-29 DIAGNOSIS — E1165 Type 2 diabetes mellitus with hyperglycemia: Secondary | ICD-10-CM | POA: Diagnosis not present

## 2020-12-06 DIAGNOSIS — M6281 Muscle weakness (generalized): Secondary | ICD-10-CM | POA: Diagnosis not present

## 2020-12-06 DIAGNOSIS — R2689 Other abnormalities of gait and mobility: Secondary | ICD-10-CM | POA: Diagnosis not present

## 2020-12-06 DIAGNOSIS — M545 Low back pain, unspecified: Secondary | ICD-10-CM | POA: Diagnosis not present

## 2020-12-12 DIAGNOSIS — M6281 Muscle weakness (generalized): Secondary | ICD-10-CM | POA: Diagnosis not present

## 2020-12-12 DIAGNOSIS — M545 Low back pain, unspecified: Secondary | ICD-10-CM | POA: Diagnosis not present

## 2020-12-12 DIAGNOSIS — R2689 Other abnormalities of gait and mobility: Secondary | ICD-10-CM | POA: Diagnosis not present

## 2020-12-20 DIAGNOSIS — R2689 Other abnormalities of gait and mobility: Secondary | ICD-10-CM | POA: Diagnosis not present

## 2020-12-20 DIAGNOSIS — M545 Low back pain, unspecified: Secondary | ICD-10-CM | POA: Diagnosis not present

## 2020-12-20 DIAGNOSIS — M6281 Muscle weakness (generalized): Secondary | ICD-10-CM | POA: Diagnosis not present

## 2020-12-21 DIAGNOSIS — I10 Essential (primary) hypertension: Secondary | ICD-10-CM | POA: Diagnosis not present

## 2020-12-21 DIAGNOSIS — E1165 Type 2 diabetes mellitus with hyperglycemia: Secondary | ICD-10-CM | POA: Diagnosis not present

## 2020-12-21 DIAGNOSIS — E119 Type 2 diabetes mellitus without complications: Secondary | ICD-10-CM | POA: Diagnosis not present

## 2020-12-21 DIAGNOSIS — F4321 Adjustment disorder with depressed mood: Secondary | ICD-10-CM | POA: Diagnosis not present

## 2020-12-21 DIAGNOSIS — I639 Cerebral infarction, unspecified: Secondary | ICD-10-CM | POA: Diagnosis not present

## 2020-12-21 DIAGNOSIS — E785 Hyperlipidemia, unspecified: Secondary | ICD-10-CM | POA: Diagnosis not present

## 2020-12-26 DIAGNOSIS — R1033 Periumbilical pain: Secondary | ICD-10-CM | POA: Diagnosis not present

## 2021-01-03 DIAGNOSIS — R2689 Other abnormalities of gait and mobility: Secondary | ICD-10-CM | POA: Diagnosis not present

## 2021-01-03 DIAGNOSIS — M6281 Muscle weakness (generalized): Secondary | ICD-10-CM | POA: Diagnosis not present

## 2021-01-03 DIAGNOSIS — M545 Low back pain, unspecified: Secondary | ICD-10-CM | POA: Diagnosis not present

## 2021-01-16 DIAGNOSIS — M6281 Muscle weakness (generalized): Secondary | ICD-10-CM | POA: Diagnosis not present

## 2021-01-16 DIAGNOSIS — M545 Low back pain, unspecified: Secondary | ICD-10-CM | POA: Diagnosis not present

## 2021-01-16 DIAGNOSIS — R2689 Other abnormalities of gait and mobility: Secondary | ICD-10-CM | POA: Diagnosis not present

## 2021-02-02 DIAGNOSIS — Z7982 Long term (current) use of aspirin: Secondary | ICD-10-CM | POA: Diagnosis not present

## 2021-02-02 DIAGNOSIS — Z7984 Long term (current) use of oral hypoglycemic drugs: Secondary | ICD-10-CM | POA: Diagnosis not present

## 2021-02-02 DIAGNOSIS — Z91018 Allergy to other foods: Secondary | ICD-10-CM | POA: Diagnosis not present

## 2021-02-02 DIAGNOSIS — I700 Atherosclerosis of aorta: Secondary | ICD-10-CM | POA: Diagnosis not present

## 2021-02-02 DIAGNOSIS — E119 Type 2 diabetes mellitus without complications: Secondary | ICD-10-CM | POA: Diagnosis not present

## 2021-02-02 DIAGNOSIS — R0789 Other chest pain: Secondary | ICD-10-CM | POA: Diagnosis not present

## 2021-02-02 DIAGNOSIS — R079 Chest pain, unspecified: Secondary | ICD-10-CM | POA: Diagnosis not present

## 2021-02-02 DIAGNOSIS — Z887 Allergy status to serum and vaccine status: Secondary | ICD-10-CM | POA: Diagnosis not present

## 2021-02-02 DIAGNOSIS — U071 COVID-19: Secondary | ICD-10-CM | POA: Diagnosis not present

## 2021-02-02 DIAGNOSIS — Z79899 Other long term (current) drug therapy: Secondary | ICD-10-CM | POA: Diagnosis not present

## 2021-02-02 DIAGNOSIS — R001 Bradycardia, unspecified: Secondary | ICD-10-CM | POA: Diagnosis not present

## 2021-02-11 DIAGNOSIS — E119 Type 2 diabetes mellitus without complications: Secondary | ICD-10-CM | POA: Diagnosis not present

## 2021-02-11 DIAGNOSIS — F4321 Adjustment disorder with depressed mood: Secondary | ICD-10-CM | POA: Diagnosis not present

## 2021-02-11 DIAGNOSIS — E785 Hyperlipidemia, unspecified: Secondary | ICD-10-CM | POA: Diagnosis not present

## 2021-02-11 DIAGNOSIS — I639 Cerebral infarction, unspecified: Secondary | ICD-10-CM | POA: Diagnosis not present

## 2021-02-11 DIAGNOSIS — I10 Essential (primary) hypertension: Secondary | ICD-10-CM | POA: Diagnosis not present

## 2021-02-11 DIAGNOSIS — E1165 Type 2 diabetes mellitus with hyperglycemia: Secondary | ICD-10-CM | POA: Diagnosis not present

## 2021-02-20 DIAGNOSIS — Z961 Presence of intraocular lens: Secondary | ICD-10-CM | POA: Diagnosis not present

## 2021-02-20 DIAGNOSIS — H40023 Open angle with borderline findings, high risk, bilateral: Secondary | ICD-10-CM | POA: Diagnosis not present

## 2021-02-20 DIAGNOSIS — E109 Type 1 diabetes mellitus without complications: Secondary | ICD-10-CM | POA: Diagnosis not present

## 2021-03-29 DIAGNOSIS — M545 Low back pain, unspecified: Secondary | ICD-10-CM | POA: Diagnosis not present

## 2021-03-29 DIAGNOSIS — I10 Essential (primary) hypertension: Secondary | ICD-10-CM | POA: Diagnosis not present

## 2021-03-29 DIAGNOSIS — S3992XA Unspecified injury of lower back, initial encounter: Secondary | ICD-10-CM | POA: Diagnosis not present

## 2021-04-23 DIAGNOSIS — I639 Cerebral infarction, unspecified: Secondary | ICD-10-CM | POA: Diagnosis not present

## 2021-04-23 DIAGNOSIS — E119 Type 2 diabetes mellitus without complications: Secondary | ICD-10-CM | POA: Diagnosis not present

## 2021-04-23 DIAGNOSIS — F4321 Adjustment disorder with depressed mood: Secondary | ICD-10-CM | POA: Diagnosis not present

## 2021-04-23 DIAGNOSIS — I10 Essential (primary) hypertension: Secondary | ICD-10-CM | POA: Diagnosis not present

## 2021-04-23 DIAGNOSIS — E1165 Type 2 diabetes mellitus with hyperglycemia: Secondary | ICD-10-CM | POA: Diagnosis not present

## 2021-04-23 DIAGNOSIS — E785 Hyperlipidemia, unspecified: Secondary | ICD-10-CM | POA: Diagnosis not present

## 2021-04-27 DIAGNOSIS — Z23 Encounter for immunization: Secondary | ICD-10-CM | POA: Diagnosis not present

## 2021-05-31 DIAGNOSIS — E785 Hyperlipidemia, unspecified: Secondary | ICD-10-CM | POA: Diagnosis not present

## 2021-05-31 DIAGNOSIS — E1165 Type 2 diabetes mellitus with hyperglycemia: Secondary | ICD-10-CM | POA: Diagnosis not present

## 2021-05-31 DIAGNOSIS — R350 Frequency of micturition: Secondary | ICD-10-CM | POA: Diagnosis not present

## 2021-05-31 DIAGNOSIS — I10 Essential (primary) hypertension: Secondary | ICD-10-CM | POA: Diagnosis not present

## 2021-05-31 DIAGNOSIS — Z7984 Long term (current) use of oral hypoglycemic drugs: Secondary | ICD-10-CM | POA: Diagnosis not present

## 2021-07-18 DIAGNOSIS — E119 Type 2 diabetes mellitus without complications: Secondary | ICD-10-CM | POA: Diagnosis not present

## 2021-07-18 DIAGNOSIS — I10 Essential (primary) hypertension: Secondary | ICD-10-CM | POA: Diagnosis not present

## 2021-07-18 DIAGNOSIS — E785 Hyperlipidemia, unspecified: Secondary | ICD-10-CM | POA: Diagnosis not present

## 2021-07-18 DIAGNOSIS — E1165 Type 2 diabetes mellitus with hyperglycemia: Secondary | ICD-10-CM | POA: Diagnosis not present

## 2021-08-03 DIAGNOSIS — M545 Low back pain, unspecified: Secondary | ICD-10-CM | POA: Diagnosis not present

## 2021-08-03 DIAGNOSIS — M6281 Muscle weakness (generalized): Secondary | ICD-10-CM | POA: Diagnosis not present

## 2021-08-10 DIAGNOSIS — M545 Low back pain, unspecified: Secondary | ICD-10-CM | POA: Diagnosis not present

## 2021-08-10 DIAGNOSIS — M6281 Muscle weakness (generalized): Secondary | ICD-10-CM | POA: Diagnosis not present

## 2021-08-15 DIAGNOSIS — M545 Low back pain, unspecified: Secondary | ICD-10-CM | POA: Diagnosis not present

## 2021-08-15 DIAGNOSIS — M6281 Muscle weakness (generalized): Secondary | ICD-10-CM | POA: Diagnosis not present

## 2021-08-20 DIAGNOSIS — M545 Low back pain, unspecified: Secondary | ICD-10-CM | POA: Diagnosis not present

## 2021-08-20 DIAGNOSIS — M6281 Muscle weakness (generalized): Secondary | ICD-10-CM | POA: Diagnosis not present

## 2021-08-29 DIAGNOSIS — M6281 Muscle weakness (generalized): Secondary | ICD-10-CM | POA: Diagnosis not present

## 2021-08-29 DIAGNOSIS — M545 Low back pain, unspecified: Secondary | ICD-10-CM | POA: Diagnosis not present

## 2021-08-31 DIAGNOSIS — M6281 Muscle weakness (generalized): Secondary | ICD-10-CM | POA: Diagnosis not present

## 2021-08-31 DIAGNOSIS — H6123 Impacted cerumen, bilateral: Secondary | ICD-10-CM | POA: Diagnosis not present

## 2021-08-31 DIAGNOSIS — M545 Low back pain, unspecified: Secondary | ICD-10-CM | POA: Diagnosis not present

## 2021-09-03 DIAGNOSIS — M545 Low back pain, unspecified: Secondary | ICD-10-CM | POA: Diagnosis not present

## 2021-09-03 DIAGNOSIS — M6281 Muscle weakness (generalized): Secondary | ICD-10-CM | POA: Diagnosis not present

## 2021-09-12 DIAGNOSIS — M545 Low back pain, unspecified: Secondary | ICD-10-CM | POA: Diagnosis not present

## 2021-09-12 DIAGNOSIS — M6281 Muscle weakness (generalized): Secondary | ICD-10-CM | POA: Diagnosis not present

## 2021-09-14 DIAGNOSIS — M6281 Muscle weakness (generalized): Secondary | ICD-10-CM | POA: Diagnosis not present

## 2021-09-14 DIAGNOSIS — M545 Low back pain, unspecified: Secondary | ICD-10-CM | POA: Diagnosis not present

## 2021-09-18 DIAGNOSIS — R350 Frequency of micturition: Secondary | ICD-10-CM | POA: Diagnosis not present

## 2021-09-18 DIAGNOSIS — R269 Unspecified abnormalities of gait and mobility: Secondary | ICD-10-CM | POA: Diagnosis not present

## 2021-09-18 DIAGNOSIS — I10 Essential (primary) hypertension: Secondary | ICD-10-CM | POA: Diagnosis not present

## 2021-09-18 DIAGNOSIS — E1165 Type 2 diabetes mellitus with hyperglycemia: Secondary | ICD-10-CM | POA: Diagnosis not present

## 2021-09-18 DIAGNOSIS — E785 Hyperlipidemia, unspecified: Secondary | ICD-10-CM | POA: Diagnosis not present

## 2021-09-21 DIAGNOSIS — E785 Hyperlipidemia, unspecified: Secondary | ICD-10-CM | POA: Diagnosis not present

## 2021-09-21 DIAGNOSIS — I10 Essential (primary) hypertension: Secondary | ICD-10-CM | POA: Diagnosis not present

## 2021-09-21 DIAGNOSIS — E1165 Type 2 diabetes mellitus with hyperglycemia: Secondary | ICD-10-CM | POA: Diagnosis not present

## 2022-04-22 DIAGNOSIS — I10 Essential (primary) hypertension: Secondary | ICD-10-CM | POA: Diagnosis not present

## 2022-04-22 DIAGNOSIS — Z8673 Personal history of transient ischemic attack (TIA), and cerebral infarction without residual deficits: Secondary | ICD-10-CM | POA: Diagnosis not present

## 2022-04-22 DIAGNOSIS — R350 Frequency of micturition: Secondary | ICD-10-CM | POA: Diagnosis not present

## 2022-04-22 DIAGNOSIS — Z0000 Encounter for general adult medical examination without abnormal findings: Secondary | ICD-10-CM | POA: Diagnosis not present

## 2022-04-22 DIAGNOSIS — R269 Unspecified abnormalities of gait and mobility: Secondary | ICD-10-CM | POA: Diagnosis not present

## 2022-04-22 DIAGNOSIS — E1165 Type 2 diabetes mellitus with hyperglycemia: Secondary | ICD-10-CM | POA: Diagnosis not present

## 2022-04-22 DIAGNOSIS — E785 Hyperlipidemia, unspecified: Secondary | ICD-10-CM | POA: Diagnosis not present

## 2022-05-01 DIAGNOSIS — E109 Type 1 diabetes mellitus without complications: Secondary | ICD-10-CM | POA: Diagnosis not present

## 2022-05-01 DIAGNOSIS — H401132 Primary open-angle glaucoma, bilateral, moderate stage: Secondary | ICD-10-CM | POA: Diagnosis not present

## 2022-05-01 DIAGNOSIS — Z961 Presence of intraocular lens: Secondary | ICD-10-CM | POA: Diagnosis not present

## 2022-05-20 DIAGNOSIS — I10 Essential (primary) hypertension: Secondary | ICD-10-CM | POA: Diagnosis not present

## 2022-05-20 DIAGNOSIS — E785 Hyperlipidemia, unspecified: Secondary | ICD-10-CM | POA: Diagnosis not present

## 2022-05-20 DIAGNOSIS — E119 Type 2 diabetes mellitus without complications: Secondary | ICD-10-CM | POA: Diagnosis not present

## 2022-10-30 DIAGNOSIS — H401132 Primary open-angle glaucoma, bilateral, moderate stage: Secondary | ICD-10-CM | POA: Diagnosis not present

## 2023-05-07 DIAGNOSIS — E1165 Type 2 diabetes mellitus with hyperglycemia: Secondary | ICD-10-CM | POA: Diagnosis not present

## 2023-05-07 DIAGNOSIS — Z8673 Personal history of transient ischemic attack (TIA), and cerebral infarction without residual deficits: Secondary | ICD-10-CM | POA: Diagnosis not present

## 2023-05-07 DIAGNOSIS — Z961 Presence of intraocular lens: Secondary | ICD-10-CM | POA: Diagnosis not present

## 2023-05-07 DIAGNOSIS — Z0000 Encounter for general adult medical examination without abnormal findings: Secondary | ICD-10-CM | POA: Diagnosis not present

## 2023-05-07 DIAGNOSIS — E785 Hyperlipidemia, unspecified: Secondary | ICD-10-CM | POA: Diagnosis not present

## 2023-05-07 DIAGNOSIS — R269 Unspecified abnormalities of gait and mobility: Secondary | ICD-10-CM | POA: Diagnosis not present

## 2023-05-07 DIAGNOSIS — H401132 Primary open-angle glaucoma, bilateral, moderate stage: Secondary | ICD-10-CM | POA: Diagnosis not present

## 2023-05-07 DIAGNOSIS — I10 Essential (primary) hypertension: Secondary | ICD-10-CM | POA: Diagnosis not present

## 2023-05-07 DIAGNOSIS — R350 Frequency of micturition: Secondary | ICD-10-CM | POA: Diagnosis not present

## 2023-05-07 DIAGNOSIS — E109 Type 1 diabetes mellitus without complications: Secondary | ICD-10-CM | POA: Diagnosis not present

## 2023-05-27 DIAGNOSIS — R269 Unspecified abnormalities of gait and mobility: Secondary | ICD-10-CM | POA: Diagnosis not present

## 2023-05-27 DIAGNOSIS — M6281 Muscle weakness (generalized): Secondary | ICD-10-CM | POA: Diagnosis not present

## 2023-05-27 DIAGNOSIS — M545 Low back pain, unspecified: Secondary | ICD-10-CM | POA: Diagnosis not present

## 2023-06-10 DIAGNOSIS — M6281 Muscle weakness (generalized): Secondary | ICD-10-CM | POA: Diagnosis not present

## 2023-06-10 DIAGNOSIS — M545 Low back pain, unspecified: Secondary | ICD-10-CM | POA: Diagnosis not present

## 2023-06-10 DIAGNOSIS — R269 Unspecified abnormalities of gait and mobility: Secondary | ICD-10-CM | POA: Diagnosis not present

## 2023-06-12 DIAGNOSIS — R269 Unspecified abnormalities of gait and mobility: Secondary | ICD-10-CM | POA: Diagnosis not present

## 2023-06-12 DIAGNOSIS — M6281 Muscle weakness (generalized): Secondary | ICD-10-CM | POA: Diagnosis not present

## 2023-06-12 DIAGNOSIS — M545 Low back pain, unspecified: Secondary | ICD-10-CM | POA: Diagnosis not present

## 2023-06-17 DIAGNOSIS — M6281 Muscle weakness (generalized): Secondary | ICD-10-CM | POA: Diagnosis not present

## 2023-06-17 DIAGNOSIS — M545 Low back pain, unspecified: Secondary | ICD-10-CM | POA: Diagnosis not present

## 2023-06-17 DIAGNOSIS — R269 Unspecified abnormalities of gait and mobility: Secondary | ICD-10-CM | POA: Diagnosis not present

## 2023-06-19 DIAGNOSIS — R269 Unspecified abnormalities of gait and mobility: Secondary | ICD-10-CM | POA: Diagnosis not present

## 2023-06-19 DIAGNOSIS — M6281 Muscle weakness (generalized): Secondary | ICD-10-CM | POA: Diagnosis not present

## 2023-06-19 DIAGNOSIS — M545 Low back pain, unspecified: Secondary | ICD-10-CM | POA: Diagnosis not present

## 2023-06-24 DIAGNOSIS — R269 Unspecified abnormalities of gait and mobility: Secondary | ICD-10-CM | POA: Diagnosis not present

## 2023-06-24 DIAGNOSIS — M6281 Muscle weakness (generalized): Secondary | ICD-10-CM | POA: Diagnosis not present

## 2023-06-24 DIAGNOSIS — M545 Low back pain, unspecified: Secondary | ICD-10-CM | POA: Diagnosis not present

## 2023-06-26 DIAGNOSIS — R269 Unspecified abnormalities of gait and mobility: Secondary | ICD-10-CM | POA: Diagnosis not present

## 2023-06-26 DIAGNOSIS — M545 Low back pain, unspecified: Secondary | ICD-10-CM | POA: Diagnosis not present

## 2023-06-26 DIAGNOSIS — M6281 Muscle weakness (generalized): Secondary | ICD-10-CM | POA: Diagnosis not present

## 2023-07-01 DIAGNOSIS — R269 Unspecified abnormalities of gait and mobility: Secondary | ICD-10-CM | POA: Diagnosis not present

## 2023-07-01 DIAGNOSIS — M6281 Muscle weakness (generalized): Secondary | ICD-10-CM | POA: Diagnosis not present

## 2023-07-01 DIAGNOSIS — M545 Low back pain, unspecified: Secondary | ICD-10-CM | POA: Diagnosis not present

## 2023-07-03 DIAGNOSIS — M6281 Muscle weakness (generalized): Secondary | ICD-10-CM | POA: Diagnosis not present

## 2023-07-03 DIAGNOSIS — R269 Unspecified abnormalities of gait and mobility: Secondary | ICD-10-CM | POA: Diagnosis not present

## 2023-07-03 DIAGNOSIS — M545 Low back pain, unspecified: Secondary | ICD-10-CM | POA: Diagnosis not present

## 2023-07-08 DIAGNOSIS — M6281 Muscle weakness (generalized): Secondary | ICD-10-CM | POA: Diagnosis not present

## 2023-07-08 DIAGNOSIS — M545 Low back pain, unspecified: Secondary | ICD-10-CM | POA: Diagnosis not present

## 2023-07-08 DIAGNOSIS — R269 Unspecified abnormalities of gait and mobility: Secondary | ICD-10-CM | POA: Diagnosis not present

## 2023-07-10 DIAGNOSIS — R269 Unspecified abnormalities of gait and mobility: Secondary | ICD-10-CM | POA: Diagnosis not present

## 2023-07-10 DIAGNOSIS — M545 Low back pain, unspecified: Secondary | ICD-10-CM | POA: Diagnosis not present

## 2023-07-10 DIAGNOSIS — M6281 Muscle weakness (generalized): Secondary | ICD-10-CM | POA: Diagnosis not present

## 2023-07-15 DIAGNOSIS — R269 Unspecified abnormalities of gait and mobility: Secondary | ICD-10-CM | POA: Diagnosis not present

## 2023-07-15 DIAGNOSIS — M6281 Muscle weakness (generalized): Secondary | ICD-10-CM | POA: Diagnosis not present

## 2023-07-15 DIAGNOSIS — M545 Low back pain, unspecified: Secondary | ICD-10-CM | POA: Diagnosis not present

## 2023-07-17 DIAGNOSIS — R269 Unspecified abnormalities of gait and mobility: Secondary | ICD-10-CM | POA: Diagnosis not present

## 2023-07-17 DIAGNOSIS — M545 Low back pain, unspecified: Secondary | ICD-10-CM | POA: Diagnosis not present

## 2023-07-17 DIAGNOSIS — M6281 Muscle weakness (generalized): Secondary | ICD-10-CM | POA: Diagnosis not present

## 2023-07-22 DIAGNOSIS — R269 Unspecified abnormalities of gait and mobility: Secondary | ICD-10-CM | POA: Diagnosis not present

## 2023-07-22 DIAGNOSIS — M6281 Muscle weakness (generalized): Secondary | ICD-10-CM | POA: Diagnosis not present

## 2023-07-22 DIAGNOSIS — M545 Low back pain, unspecified: Secondary | ICD-10-CM | POA: Diagnosis not present

## 2023-07-24 DIAGNOSIS — R269 Unspecified abnormalities of gait and mobility: Secondary | ICD-10-CM | POA: Diagnosis not present

## 2023-07-24 DIAGNOSIS — M545 Low back pain, unspecified: Secondary | ICD-10-CM | POA: Diagnosis not present

## 2023-07-24 DIAGNOSIS — M6281 Muscle weakness (generalized): Secondary | ICD-10-CM | POA: Diagnosis not present

## 2023-07-29 DIAGNOSIS — M6281 Muscle weakness (generalized): Secondary | ICD-10-CM | POA: Diagnosis not present

## 2023-07-29 DIAGNOSIS — M545 Low back pain, unspecified: Secondary | ICD-10-CM | POA: Diagnosis not present

## 2023-07-29 DIAGNOSIS — R269 Unspecified abnormalities of gait and mobility: Secondary | ICD-10-CM | POA: Diagnosis not present

## 2023-07-31 DIAGNOSIS — M545 Low back pain, unspecified: Secondary | ICD-10-CM | POA: Diagnosis not present

## 2023-07-31 DIAGNOSIS — M6281 Muscle weakness (generalized): Secondary | ICD-10-CM | POA: Diagnosis not present

## 2023-07-31 DIAGNOSIS — R269 Unspecified abnormalities of gait and mobility: Secondary | ICD-10-CM | POA: Diagnosis not present

## 2023-08-05 DIAGNOSIS — M545 Low back pain, unspecified: Secondary | ICD-10-CM | POA: Diagnosis not present

## 2023-08-05 DIAGNOSIS — R269 Unspecified abnormalities of gait and mobility: Secondary | ICD-10-CM | POA: Diagnosis not present

## 2023-08-05 DIAGNOSIS — M6281 Muscle weakness (generalized): Secondary | ICD-10-CM | POA: Diagnosis not present

## 2023-08-07 DIAGNOSIS — M545 Low back pain, unspecified: Secondary | ICD-10-CM | POA: Diagnosis not present

## 2023-08-07 DIAGNOSIS — R269 Unspecified abnormalities of gait and mobility: Secondary | ICD-10-CM | POA: Diagnosis not present

## 2023-08-07 DIAGNOSIS — M6281 Muscle weakness (generalized): Secondary | ICD-10-CM | POA: Diagnosis not present

## 2023-08-12 DIAGNOSIS — R269 Unspecified abnormalities of gait and mobility: Secondary | ICD-10-CM | POA: Diagnosis not present

## 2023-08-12 DIAGNOSIS — M545 Low back pain, unspecified: Secondary | ICD-10-CM | POA: Diagnosis not present

## 2023-08-12 DIAGNOSIS — M6281 Muscle weakness (generalized): Secondary | ICD-10-CM | POA: Diagnosis not present

## 2023-08-14 DIAGNOSIS — R269 Unspecified abnormalities of gait and mobility: Secondary | ICD-10-CM | POA: Diagnosis not present

## 2023-08-14 DIAGNOSIS — M545 Low back pain, unspecified: Secondary | ICD-10-CM | POA: Diagnosis not present

## 2023-08-14 DIAGNOSIS — M6281 Muscle weakness (generalized): Secondary | ICD-10-CM | POA: Diagnosis not present

## 2023-08-19 DIAGNOSIS — R269 Unspecified abnormalities of gait and mobility: Secondary | ICD-10-CM | POA: Diagnosis not present

## 2023-08-19 DIAGNOSIS — M6281 Muscle weakness (generalized): Secondary | ICD-10-CM | POA: Diagnosis not present

## 2023-08-19 DIAGNOSIS — M545 Low back pain, unspecified: Secondary | ICD-10-CM | POA: Diagnosis not present

## 2023-08-21 DIAGNOSIS — R269 Unspecified abnormalities of gait and mobility: Secondary | ICD-10-CM | POA: Diagnosis not present

## 2023-08-21 DIAGNOSIS — M545 Low back pain, unspecified: Secondary | ICD-10-CM | POA: Diagnosis not present

## 2023-08-21 DIAGNOSIS — M6281 Muscle weakness (generalized): Secondary | ICD-10-CM | POA: Diagnosis not present

## 2023-08-28 DIAGNOSIS — M545 Low back pain, unspecified: Secondary | ICD-10-CM | POA: Diagnosis not present

## 2023-08-28 DIAGNOSIS — M6281 Muscle weakness (generalized): Secondary | ICD-10-CM | POA: Diagnosis not present

## 2023-08-28 DIAGNOSIS — R269 Unspecified abnormalities of gait and mobility: Secondary | ICD-10-CM | POA: Diagnosis not present

## 2023-09-04 DIAGNOSIS — M545 Low back pain, unspecified: Secondary | ICD-10-CM | POA: Diagnosis not present

## 2023-09-04 DIAGNOSIS — R269 Unspecified abnormalities of gait and mobility: Secondary | ICD-10-CM | POA: Diagnosis not present

## 2023-09-04 DIAGNOSIS — M6281 Muscle weakness (generalized): Secondary | ICD-10-CM | POA: Diagnosis not present

## 2023-09-09 DIAGNOSIS — M545 Low back pain, unspecified: Secondary | ICD-10-CM | POA: Diagnosis not present

## 2023-09-09 DIAGNOSIS — R269 Unspecified abnormalities of gait and mobility: Secondary | ICD-10-CM | POA: Diagnosis not present

## 2023-09-09 DIAGNOSIS — M6281 Muscle weakness (generalized): Secondary | ICD-10-CM | POA: Diagnosis not present

## 2023-09-11 DIAGNOSIS — M6281 Muscle weakness (generalized): Secondary | ICD-10-CM | POA: Diagnosis not present

## 2023-09-11 DIAGNOSIS — R269 Unspecified abnormalities of gait and mobility: Secondary | ICD-10-CM | POA: Diagnosis not present

## 2023-09-11 DIAGNOSIS — M545 Low back pain, unspecified: Secondary | ICD-10-CM | POA: Diagnosis not present

## 2023-09-16 DIAGNOSIS — M6281 Muscle weakness (generalized): Secondary | ICD-10-CM | POA: Diagnosis not present

## 2023-09-16 DIAGNOSIS — M545 Low back pain, unspecified: Secondary | ICD-10-CM | POA: Diagnosis not present

## 2023-09-16 DIAGNOSIS — R269 Unspecified abnormalities of gait and mobility: Secondary | ICD-10-CM | POA: Diagnosis not present

## 2023-09-18 DIAGNOSIS — M6281 Muscle weakness (generalized): Secondary | ICD-10-CM | POA: Diagnosis not present

## 2023-09-18 DIAGNOSIS — M545 Low back pain, unspecified: Secondary | ICD-10-CM | POA: Diagnosis not present

## 2023-09-18 DIAGNOSIS — R269 Unspecified abnormalities of gait and mobility: Secondary | ICD-10-CM | POA: Diagnosis not present

## 2023-09-23 DIAGNOSIS — M545 Low back pain, unspecified: Secondary | ICD-10-CM | POA: Diagnosis not present

## 2023-09-23 DIAGNOSIS — R269 Unspecified abnormalities of gait and mobility: Secondary | ICD-10-CM | POA: Diagnosis not present

## 2023-09-23 DIAGNOSIS — M6281 Muscle weakness (generalized): Secondary | ICD-10-CM | POA: Diagnosis not present

## 2023-09-25 DIAGNOSIS — M545 Low back pain, unspecified: Secondary | ICD-10-CM | POA: Diagnosis not present

## 2023-09-25 DIAGNOSIS — M6281 Muscle weakness (generalized): Secondary | ICD-10-CM | POA: Diagnosis not present

## 2023-09-25 DIAGNOSIS — R269 Unspecified abnormalities of gait and mobility: Secondary | ICD-10-CM | POA: Diagnosis not present

## 2023-10-02 DIAGNOSIS — R269 Unspecified abnormalities of gait and mobility: Secondary | ICD-10-CM | POA: Diagnosis not present

## 2023-10-02 DIAGNOSIS — M6281 Muscle weakness (generalized): Secondary | ICD-10-CM | POA: Diagnosis not present

## 2023-10-02 DIAGNOSIS — M545 Low back pain, unspecified: Secondary | ICD-10-CM | POA: Diagnosis not present

## 2023-10-07 DIAGNOSIS — M545 Low back pain, unspecified: Secondary | ICD-10-CM | POA: Diagnosis not present

## 2023-10-07 DIAGNOSIS — R269 Unspecified abnormalities of gait and mobility: Secondary | ICD-10-CM | POA: Diagnosis not present

## 2023-10-07 DIAGNOSIS — M6281 Muscle weakness (generalized): Secondary | ICD-10-CM | POA: Diagnosis not present

## 2023-10-09 DIAGNOSIS — M6281 Muscle weakness (generalized): Secondary | ICD-10-CM | POA: Diagnosis not present

## 2023-10-09 DIAGNOSIS — R269 Unspecified abnormalities of gait and mobility: Secondary | ICD-10-CM | POA: Diagnosis not present

## 2023-10-09 DIAGNOSIS — M545 Low back pain, unspecified: Secondary | ICD-10-CM | POA: Diagnosis not present

## 2023-10-16 DIAGNOSIS — M6281 Muscle weakness (generalized): Secondary | ICD-10-CM | POA: Diagnosis not present

## 2023-10-16 DIAGNOSIS — M545 Low back pain, unspecified: Secondary | ICD-10-CM | POA: Diagnosis not present

## 2023-10-16 DIAGNOSIS — R269 Unspecified abnormalities of gait and mobility: Secondary | ICD-10-CM | POA: Diagnosis not present

## 2023-12-30 DIAGNOSIS — I10 Essential (primary) hypertension: Secondary | ICD-10-CM | POA: Diagnosis not present

## 2023-12-30 DIAGNOSIS — E785 Hyperlipidemia, unspecified: Secondary | ICD-10-CM | POA: Diagnosis not present

## 2023-12-30 DIAGNOSIS — Z6823 Body mass index (BMI) 23.0-23.9, adult: Secondary | ICD-10-CM | POA: Diagnosis not present

## 2023-12-30 DIAGNOSIS — E119 Type 2 diabetes mellitus without complications: Secondary | ICD-10-CM | POA: Diagnosis not present

## 2024-01-12 DIAGNOSIS — E109 Type 1 diabetes mellitus without complications: Secondary | ICD-10-CM | POA: Diagnosis not present

## 2024-01-12 DIAGNOSIS — H401132 Primary open-angle glaucoma, bilateral, moderate stage: Secondary | ICD-10-CM | POA: Diagnosis not present

## 2024-01-12 DIAGNOSIS — Z961 Presence of intraocular lens: Secondary | ICD-10-CM | POA: Diagnosis not present

## 2024-02-02 DIAGNOSIS — H1130 Conjunctival hemorrhage, unspecified eye: Secondary | ICD-10-CM | POA: Diagnosis not present

## 2024-02-02 DIAGNOSIS — S00419A Abrasion of unspecified ear, initial encounter: Secondary | ICD-10-CM | POA: Diagnosis not present

## 2024-02-27 DIAGNOSIS — Z8679 Personal history of other diseases of the circulatory system: Secondary | ICD-10-CM | POA: Diagnosis not present

## 2024-02-27 DIAGNOSIS — I10 Essential (primary) hypertension: Secondary | ICD-10-CM | POA: Diagnosis not present

## 2024-02-27 DIAGNOSIS — I2584 Coronary atherosclerosis due to calcified coronary lesion: Secondary | ICD-10-CM | POA: Diagnosis not present

## 2024-02-27 DIAGNOSIS — R9439 Abnormal result of other cardiovascular function study: Secondary | ICD-10-CM | POA: Diagnosis not present

## 2024-02-27 DIAGNOSIS — Z66 Do not resuscitate: Secondary | ICD-10-CM | POA: Diagnosis not present

## 2024-02-27 DIAGNOSIS — I358 Other nonrheumatic aortic valve disorders: Secondary | ICD-10-CM | POA: Diagnosis not present

## 2024-02-27 DIAGNOSIS — Z8673 Personal history of transient ischemic attack (TIA), and cerebral infarction without residual deficits: Secondary | ICD-10-CM | POA: Diagnosis not present

## 2024-02-27 DIAGNOSIS — F32A Depression, unspecified: Secondary | ICD-10-CM | POA: Diagnosis not present

## 2024-02-27 DIAGNOSIS — D649 Anemia, unspecified: Secondary | ICD-10-CM | POA: Diagnosis not present

## 2024-02-27 DIAGNOSIS — I214 Non-ST elevation (NSTEMI) myocardial infarction: Secondary | ICD-10-CM | POA: Diagnosis not present

## 2024-02-27 DIAGNOSIS — R0602 Shortness of breath: Secondary | ICD-10-CM | POA: Diagnosis not present

## 2024-02-27 DIAGNOSIS — I471 Supraventricular tachycardia, unspecified: Secondary | ICD-10-CM | POA: Diagnosis not present

## 2024-02-27 DIAGNOSIS — I2510 Atherosclerotic heart disease of native coronary artery without angina pectoris: Secondary | ICD-10-CM | POA: Diagnosis not present

## 2024-02-27 DIAGNOSIS — G3184 Mild cognitive impairment, so stated: Secondary | ICD-10-CM | POA: Diagnosis not present

## 2024-02-27 DIAGNOSIS — R509 Fever, unspecified: Secondary | ICD-10-CM | POA: Diagnosis not present

## 2024-02-27 DIAGNOSIS — Z1152 Encounter for screening for COVID-19: Secondary | ICD-10-CM | POA: Diagnosis not present

## 2024-02-27 DIAGNOSIS — E1169 Type 2 diabetes mellitus with other specified complication: Secondary | ICD-10-CM | POA: Diagnosis not present

## 2024-02-27 DIAGNOSIS — R55 Syncope and collapse: Secondary | ICD-10-CM | POA: Diagnosis not present

## 2024-02-27 DIAGNOSIS — I517 Cardiomegaly: Secondary | ICD-10-CM | POA: Diagnosis not present

## 2024-02-27 DIAGNOSIS — Z7982 Long term (current) use of aspirin: Secondary | ICD-10-CM | POA: Diagnosis not present

## 2024-02-27 DIAGNOSIS — Z79899 Other long term (current) drug therapy: Secondary | ICD-10-CM | POA: Diagnosis not present

## 2024-02-27 DIAGNOSIS — I4510 Unspecified right bundle-branch block: Secondary | ICD-10-CM | POA: Diagnosis not present

## 2024-02-27 DIAGNOSIS — R000 Tachycardia, unspecified: Secondary | ICD-10-CM | POA: Diagnosis not present

## 2024-02-27 DIAGNOSIS — Z7984 Long term (current) use of oral hypoglycemic drugs: Secondary | ICD-10-CM | POA: Diagnosis not present

## 2024-02-27 DIAGNOSIS — E119 Type 2 diabetes mellitus without complications: Secondary | ICD-10-CM | POA: Diagnosis not present

## 2024-02-27 DIAGNOSIS — R4182 Altered mental status, unspecified: Secondary | ICD-10-CM | POA: Diagnosis not present

## 2024-03-09 DIAGNOSIS — R5383 Other fatigue: Secondary | ICD-10-CM | POA: Diagnosis not present

## 2024-03-09 DIAGNOSIS — I2510 Atherosclerotic heart disease of native coronary artery without angina pectoris: Secondary | ICD-10-CM | POA: Diagnosis not present

## 2024-03-09 DIAGNOSIS — R55 Syncope and collapse: Secondary | ICD-10-CM | POA: Diagnosis not present

## 2024-03-09 DIAGNOSIS — Z6822 Body mass index (BMI) 22.0-22.9, adult: Secondary | ICD-10-CM | POA: Diagnosis not present

## 2024-03-09 DIAGNOSIS — I10 Essential (primary) hypertension: Secondary | ICD-10-CM | POA: Diagnosis not present

## 2024-03-09 DIAGNOSIS — E119 Type 2 diabetes mellitus without complications: Secondary | ICD-10-CM | POA: Diagnosis not present

## 2024-03-24 DIAGNOSIS — I10 Essential (primary) hypertension: Secondary | ICD-10-CM | POA: Diagnosis not present

## 2024-03-24 DIAGNOSIS — N289 Disorder of kidney and ureter, unspecified: Secondary | ICD-10-CM | POA: Diagnosis not present

## 2024-03-24 DIAGNOSIS — D649 Anemia, unspecified: Secondary | ICD-10-CM | POA: Diagnosis not present

## 2024-04-13 DIAGNOSIS — Z8673 Personal history of transient ischemic attack (TIA), and cerebral infarction without residual deficits: Secondary | ICD-10-CM | POA: Diagnosis not present

## 2024-04-13 DIAGNOSIS — I25118 Atherosclerotic heart disease of native coronary artery with other forms of angina pectoris: Secondary | ICD-10-CM | POA: Diagnosis not present

## 2024-04-13 DIAGNOSIS — L299 Pruritus, unspecified: Secondary | ICD-10-CM | POA: Diagnosis not present

## 2024-04-13 DIAGNOSIS — I10 Essential (primary) hypertension: Secondary | ICD-10-CM | POA: Diagnosis not present

## 2024-04-20 DIAGNOSIS — I25118 Atherosclerotic heart disease of native coronary artery with other forms of angina pectoris: Secondary | ICD-10-CM | POA: Diagnosis not present

## 2024-04-20 DIAGNOSIS — I10 Essential (primary) hypertension: Secondary | ICD-10-CM | POA: Diagnosis not present

## 2024-04-20 DIAGNOSIS — Z8673 Personal history of transient ischemic attack (TIA), and cerebral infarction without residual deficits: Secondary | ICD-10-CM | POA: Diagnosis not present

## 2024-04-30 DIAGNOSIS — Z8673 Personal history of transient ischemic attack (TIA), and cerebral infarction without residual deficits: Secondary | ICD-10-CM | POA: Diagnosis not present

## 2024-04-30 DIAGNOSIS — Z6822 Body mass index (BMI) 22.0-22.9, adult: Secondary | ICD-10-CM | POA: Diagnosis not present

## 2024-04-30 DIAGNOSIS — I10 Essential (primary) hypertension: Secondary | ICD-10-CM | POA: Diagnosis not present

## 2024-04-30 DIAGNOSIS — I2510 Atherosclerotic heart disease of native coronary artery without angina pectoris: Secondary | ICD-10-CM | POA: Diagnosis not present

## 2024-04-30 DIAGNOSIS — E119 Type 2 diabetes mellitus without complications: Secondary | ICD-10-CM | POA: Diagnosis not present

## 2024-04-30 DIAGNOSIS — E785 Hyperlipidemia, unspecified: Secondary | ICD-10-CM | POA: Diagnosis not present

## 2024-05-04 DIAGNOSIS — E785 Hyperlipidemia, unspecified: Secondary | ICD-10-CM | POA: Diagnosis not present

## 2024-05-04 DIAGNOSIS — E119 Type 2 diabetes mellitus without complications: Secondary | ICD-10-CM | POA: Diagnosis not present

## 2024-05-04 DIAGNOSIS — I10 Essential (primary) hypertension: Secondary | ICD-10-CM | POA: Diagnosis not present

## 2024-05-24 DIAGNOSIS — R072 Precordial pain: Secondary | ICD-10-CM | POA: Diagnosis not present

## 2024-05-24 DIAGNOSIS — R0602 Shortness of breath: Secondary | ICD-10-CM | POA: Diagnosis not present

## 2024-05-24 DIAGNOSIS — E785 Hyperlipidemia, unspecified: Secondary | ICD-10-CM | POA: Diagnosis not present

## 2024-05-24 DIAGNOSIS — R7989 Other specified abnormal findings of blood chemistry: Secondary | ICD-10-CM | POA: Diagnosis not present

## 2024-05-24 DIAGNOSIS — Z7984 Long term (current) use of oral hypoglycemic drugs: Secondary | ICD-10-CM | POA: Diagnosis not present

## 2024-05-24 DIAGNOSIS — R079 Chest pain, unspecified: Secondary | ICD-10-CM | POA: Diagnosis not present

## 2024-05-24 DIAGNOSIS — R0789 Other chest pain: Secondary | ICD-10-CM | POA: Diagnosis not present

## 2024-05-24 DIAGNOSIS — R42 Dizziness and giddiness: Secondary | ICD-10-CM | POA: Diagnosis not present

## 2024-05-24 DIAGNOSIS — F32A Depression, unspecified: Secondary | ICD-10-CM | POA: Diagnosis not present

## 2024-05-24 DIAGNOSIS — Z7982 Long term (current) use of aspirin: Secondary | ICD-10-CM | POA: Diagnosis not present

## 2024-05-24 DIAGNOSIS — I2510 Atherosclerotic heart disease of native coronary artery without angina pectoris: Secondary | ICD-10-CM | POA: Diagnosis not present

## 2024-05-24 DIAGNOSIS — I4510 Unspecified right bundle-branch block: Secondary | ICD-10-CM | POA: Diagnosis not present

## 2024-05-24 DIAGNOSIS — E119 Type 2 diabetes mellitus without complications: Secondary | ICD-10-CM | POA: Diagnosis not present

## 2024-05-24 DIAGNOSIS — I10 Essential (primary) hypertension: Secondary | ICD-10-CM | POA: Diagnosis not present

## 2024-05-24 DIAGNOSIS — Z79899 Other long term (current) drug therapy: Secondary | ICD-10-CM | POA: Diagnosis not present

## 2024-05-28 DIAGNOSIS — Z6821 Body mass index (BMI) 21.0-21.9, adult: Secondary | ICD-10-CM | POA: Diagnosis not present

## 2024-05-28 DIAGNOSIS — I25118 Atherosclerotic heart disease of native coronary artery with other forms of angina pectoris: Secondary | ICD-10-CM | POA: Diagnosis not present

## 2024-05-28 DIAGNOSIS — R1013 Epigastric pain: Secondary | ICD-10-CM | POA: Diagnosis not present

## 2024-06-02 DIAGNOSIS — E119 Type 2 diabetes mellitus without complications: Secondary | ICD-10-CM | POA: Diagnosis not present

## 2024-06-02 DIAGNOSIS — E785 Hyperlipidemia, unspecified: Secondary | ICD-10-CM | POA: Diagnosis not present

## 2024-06-02 DIAGNOSIS — I10 Essential (primary) hypertension: Secondary | ICD-10-CM | POA: Diagnosis not present

## 2024-06-07 DIAGNOSIS — K219 Gastro-esophageal reflux disease without esophagitis: Secondary | ICD-10-CM | POA: Diagnosis not present

## 2024-06-07 DIAGNOSIS — R1310 Dysphagia, unspecified: Secondary | ICD-10-CM | POA: Diagnosis not present
